# Patient Record
Sex: Male | Born: 1935 | Race: White | Hispanic: No | Marital: Married | Smoking: Never smoker
Health system: Southern US, Community
[De-identification: ages and names within clinical notes are randomized; demographics above are authoritative.]

## PROBLEM LIST (undated history)

## (undated) DIAGNOSIS — I350 Nonrheumatic aortic (valve) stenosis: Secondary | ICD-10-CM

## (undated) DIAGNOSIS — E785 Hyperlipidemia, unspecified: Secondary | ICD-10-CM

## (undated) DIAGNOSIS — M1990 Unspecified osteoarthritis, unspecified site: Secondary | ICD-10-CM

## (undated) DIAGNOSIS — J8410 Pulmonary fibrosis, unspecified: Secondary | ICD-10-CM

## (undated) DIAGNOSIS — C801 Malignant (primary) neoplasm, unspecified: Secondary | ICD-10-CM

## (undated) DIAGNOSIS — Z87442 Personal history of urinary calculi: Secondary | ICD-10-CM

## (undated) DIAGNOSIS — I10 Essential (primary) hypertension: Secondary | ICD-10-CM

## (undated) DIAGNOSIS — K219 Gastro-esophageal reflux disease without esophagitis: Secondary | ICD-10-CM

## (undated) DIAGNOSIS — T7840XA Allergy, unspecified, initial encounter: Secondary | ICD-10-CM

## (undated) HISTORY — DX: Malignant (primary) neoplasm, unspecified: C80.1

## (undated) HISTORY — DX: Nonrheumatic aortic (valve) stenosis: I35.0

## (undated) HISTORY — DX: Gastro-esophageal reflux disease without esophagitis: K21.9

## (undated) HISTORY — DX: Allergy, unspecified, initial encounter: T78.40XA

## (undated) HISTORY — DX: Unspecified osteoarthritis, unspecified site: M19.90

## (undated) HISTORY — PX: JOINT REPLACEMENT: SHX530

## (undated) HISTORY — DX: Personal history of urinary calculi: Z87.442

## (undated) HISTORY — DX: Essential (primary) hypertension: I10

## (undated) HISTORY — DX: Hyperlipidemia, unspecified: E78.5

## (undated) HISTORY — DX: Pulmonary fibrosis, unspecified: J84.10

## (undated) HISTORY — PX: APPENDECTOMY: SHX54

## (undated) SURGERY — RIGHT/LEFT HEART CATH AND CORONARY ANGIOGRAPHY
Anesthesia: Moderate Sedation

---

## 2004-08-30 ENCOUNTER — Ambulatory Visit: Payer: Self-pay | Admitting: Internal Medicine

## 2006-08-30 ENCOUNTER — Ambulatory Visit: Payer: Self-pay | Admitting: Urology

## 2006-09-17 ENCOUNTER — Ambulatory Visit: Payer: Self-pay | Admitting: Radiation Oncology

## 2006-09-21 ENCOUNTER — Ambulatory Visit: Payer: Self-pay | Admitting: Radiation Oncology

## 2006-10-02 ENCOUNTER — Ambulatory Visit: Payer: Self-pay | Admitting: Radiation Oncology

## 2006-10-22 ENCOUNTER — Ambulatory Visit: Payer: Self-pay | Admitting: Radiation Oncology

## 2006-11-21 ENCOUNTER — Ambulatory Visit: Payer: Self-pay | Admitting: Radiation Oncology

## 2006-12-22 ENCOUNTER — Ambulatory Visit: Payer: Self-pay | Admitting: Radiation Oncology

## 2007-01-21 ENCOUNTER — Ambulatory Visit: Payer: Self-pay | Admitting: Radiation Oncology

## 2007-03-24 ENCOUNTER — Ambulatory Visit: Payer: Self-pay | Admitting: Radiation Oncology

## 2007-04-17 ENCOUNTER — Ambulatory Visit: Payer: Self-pay | Admitting: Radiation Oncology

## 2007-04-23 ENCOUNTER — Ambulatory Visit: Payer: Self-pay | Admitting: Radiation Oncology

## 2007-09-21 ENCOUNTER — Ambulatory Visit: Payer: Self-pay | Admitting: Radiation Oncology

## 2007-10-16 ENCOUNTER — Ambulatory Visit: Payer: Self-pay | Admitting: Radiation Oncology

## 2007-10-22 ENCOUNTER — Ambulatory Visit: Payer: Self-pay | Admitting: Radiation Oncology

## 2008-04-22 ENCOUNTER — Ambulatory Visit: Payer: Self-pay | Admitting: Radiation Oncology

## 2008-05-23 ENCOUNTER — Ambulatory Visit: Payer: Self-pay | Admitting: Radiation Oncology

## 2008-10-21 ENCOUNTER — Ambulatory Visit: Payer: Self-pay | Admitting: Radiation Oncology

## 2008-10-26 ENCOUNTER — Ambulatory Visit: Payer: Self-pay | Admitting: Radiation Oncology

## 2008-11-20 ENCOUNTER — Ambulatory Visit: Payer: Self-pay | Admitting: Radiation Oncology

## 2009-02-07 ENCOUNTER — Ambulatory Visit: Payer: Self-pay | Admitting: Specialist

## 2009-02-24 ENCOUNTER — Inpatient Hospital Stay: Payer: Self-pay | Admitting: Specialist

## 2009-12-01 ENCOUNTER — Ambulatory Visit: Payer: Self-pay | Admitting: Urology

## 2010-05-29 ENCOUNTER — Ambulatory Visit: Payer: Self-pay | Admitting: Specialist

## 2010-06-06 ENCOUNTER — Ambulatory Visit: Payer: Self-pay | Admitting: Specialist

## 2010-06-07 ENCOUNTER — Inpatient Hospital Stay: Payer: Self-pay | Admitting: Specialist

## 2010-06-08 LAB — PATHOLOGY REPORT

## 2011-09-27 DIAGNOSIS — J3489 Other specified disorders of nose and nasal sinuses: Secondary | ICD-10-CM | POA: Diagnosis not present

## 2011-09-27 DIAGNOSIS — H6120 Impacted cerumen, unspecified ear: Secondary | ICD-10-CM | POA: Diagnosis not present

## 2011-09-27 DIAGNOSIS — H905 Unspecified sensorineural hearing loss: Secondary | ICD-10-CM | POA: Diagnosis not present

## 2011-10-10 DIAGNOSIS — H40039 Anatomical narrow angle, unspecified eye: Secondary | ICD-10-CM | POA: Diagnosis not present

## 2011-10-10 DIAGNOSIS — H35359 Cystoid macular degeneration, unspecified eye: Secondary | ICD-10-CM | POA: Diagnosis not present

## 2011-10-10 DIAGNOSIS — I10 Essential (primary) hypertension: Secondary | ICD-10-CM | POA: Diagnosis not present

## 2011-10-10 DIAGNOSIS — H2589 Other age-related cataract: Secondary | ICD-10-CM | POA: Diagnosis not present

## 2011-10-26 DIAGNOSIS — R310 Gross hematuria: Secondary | ICD-10-CM | POA: Diagnosis not present

## 2011-11-01 ENCOUNTER — Ambulatory Visit: Payer: Self-pay | Admitting: Urology

## 2011-11-01 DIAGNOSIS — Z1389 Encounter for screening for other disorder: Secondary | ICD-10-CM | POA: Diagnosis not present

## 2011-11-01 DIAGNOSIS — N200 Calculus of kidney: Secondary | ICD-10-CM | POA: Diagnosis not present

## 2011-11-01 DIAGNOSIS — K8020 Calculus of gallbladder without cholecystitis without obstruction: Secondary | ICD-10-CM | POA: Diagnosis not present

## 2011-11-01 DIAGNOSIS — K5730 Diverticulosis of large intestine without perforation or abscess without bleeding: Secondary | ICD-10-CM | POA: Diagnosis not present

## 2011-11-01 LAB — CREATININE, SERUM
Creatinine: 0.88 mg/dL (ref 0.60–1.30)
EGFR (African American): >60
EGFR (Non-African Amer.): >60

## 2011-11-05 DIAGNOSIS — N200 Calculus of kidney: Secondary | ICD-10-CM | POA: Diagnosis not present

## 2011-11-05 DIAGNOSIS — K8020 Calculus of gallbladder without cholecystitis without obstruction: Secondary | ICD-10-CM | POA: Diagnosis not present

## 2011-11-05 DIAGNOSIS — K5730 Diverticulosis of large intestine without perforation or abscess without bleeding: Secondary | ICD-10-CM | POA: Diagnosis not present

## 2011-11-05 DIAGNOSIS — Z1389 Encounter for screening for other disorder: Secondary | ICD-10-CM | POA: Diagnosis not present

## 2011-11-19 DIAGNOSIS — R310 Gross hematuria: Secondary | ICD-10-CM | POA: Diagnosis not present

## 2011-11-20 DIAGNOSIS — I10 Essential (primary) hypertension: Secondary | ICD-10-CM | POA: Diagnosis not present

## 2011-11-20 DIAGNOSIS — E785 Hyperlipidemia, unspecified: Secondary | ICD-10-CM | POA: Diagnosis not present

## 2011-12-12 DIAGNOSIS — C61 Malignant neoplasm of prostate: Secondary | ICD-10-CM | POA: Diagnosis not present

## 2011-12-14 DIAGNOSIS — M109 Gout, unspecified: Secondary | ICD-10-CM | POA: Diagnosis not present

## 2012-02-12 DIAGNOSIS — D485 Neoplasm of uncertain behavior of skin: Secondary | ICD-10-CM | POA: Diagnosis not present

## 2012-02-12 DIAGNOSIS — L821 Other seborrheic keratosis: Secondary | ICD-10-CM | POA: Diagnosis not present

## 2012-02-12 DIAGNOSIS — L820 Inflamed seborrheic keratosis: Secondary | ICD-10-CM | POA: Diagnosis not present

## 2012-02-12 DIAGNOSIS — L570 Actinic keratosis: Secondary | ICD-10-CM | POA: Diagnosis not present

## 2012-04-16 DIAGNOSIS — H40019 Open angle with borderline findings, low risk, unspecified eye: Secondary | ICD-10-CM | POA: Diagnosis not present

## 2012-04-16 DIAGNOSIS — H2589 Other age-related cataract: Secondary | ICD-10-CM | POA: Diagnosis not present

## 2012-04-16 DIAGNOSIS — H35359 Cystoid macular degeneration, unspecified eye: Secondary | ICD-10-CM | POA: Diagnosis not present

## 2012-05-07 DIAGNOSIS — J3489 Other specified disorders of nose and nasal sinuses: Secondary | ICD-10-CM | POA: Diagnosis not present

## 2012-05-07 DIAGNOSIS — H6120 Impacted cerumen, unspecified ear: Secondary | ICD-10-CM | POA: Diagnosis not present

## 2012-05-21 DIAGNOSIS — Z23 Encounter for immunization: Secondary | ICD-10-CM | POA: Diagnosis not present

## 2012-06-11 DIAGNOSIS — Z125 Encounter for screening for malignant neoplasm of prostate: Secondary | ICD-10-CM | POA: Diagnosis not present

## 2012-06-11 DIAGNOSIS — I10 Essential (primary) hypertension: Secondary | ICD-10-CM | POA: Diagnosis not present

## 2012-06-11 DIAGNOSIS — Z0000 Encounter for general adult medical examination without abnormal findings: Secondary | ICD-10-CM | POA: Diagnosis not present

## 2012-06-11 DIAGNOSIS — E785 Hyperlipidemia, unspecified: Secondary | ICD-10-CM | POA: Diagnosis not present

## 2012-06-11 DIAGNOSIS — D4959 Neoplasm of unspecified behavior of other genitourinary organ: Secondary | ICD-10-CM | POA: Diagnosis not present

## 2012-06-13 ENCOUNTER — Ambulatory Visit: Payer: Self-pay | Admitting: Family Medicine

## 2012-06-13 DIAGNOSIS — Q619 Cystic kidney disease, unspecified: Secondary | ICD-10-CM | POA: Diagnosis not present

## 2012-06-13 DIAGNOSIS — N200 Calculus of kidney: Secondary | ICD-10-CM | POA: Diagnosis not present

## 2012-06-13 DIAGNOSIS — K8020 Calculus of gallbladder without cholecystitis without obstruction: Secondary | ICD-10-CM | POA: Diagnosis not present

## 2012-06-13 DIAGNOSIS — R112 Nausea with vomiting, unspecified: Secondary | ICD-10-CM | POA: Diagnosis not present

## 2012-06-13 DIAGNOSIS — R0989 Other specified symptoms and signs involving the circulatory and respiratory systems: Secondary | ICD-10-CM | POA: Diagnosis not present

## 2012-06-13 DIAGNOSIS — R109 Unspecified abdominal pain: Secondary | ICD-10-CM | POA: Diagnosis not present

## 2012-10-28 DIAGNOSIS — H40009 Preglaucoma, unspecified, unspecified eye: Secondary | ICD-10-CM | POA: Diagnosis not present

## 2012-10-28 DIAGNOSIS — E7800 Pure hypercholesterolemia, unspecified: Secondary | ICD-10-CM | POA: Diagnosis not present

## 2012-10-28 DIAGNOSIS — M5412 Radiculopathy, cervical region: Secondary | ICD-10-CM | POA: Diagnosis not present

## 2012-10-28 DIAGNOSIS — I10 Essential (primary) hypertension: Secondary | ICD-10-CM | POA: Diagnosis not present

## 2012-10-28 DIAGNOSIS — G5600 Carpal tunnel syndrome, unspecified upper limb: Secondary | ICD-10-CM | POA: Diagnosis not present

## 2012-11-06 DIAGNOSIS — H6120 Impacted cerumen, unspecified ear: Secondary | ICD-10-CM | POA: Diagnosis not present

## 2012-11-20 DIAGNOSIS — G5600 Carpal tunnel syndrome, unspecified upper limb: Secondary | ICD-10-CM | POA: Diagnosis not present

## 2012-11-20 DIAGNOSIS — M5412 Radiculopathy, cervical region: Secondary | ICD-10-CM | POA: Diagnosis not present

## 2012-11-24 DIAGNOSIS — M19049 Primary osteoarthritis, unspecified hand: Secondary | ICD-10-CM | POA: Diagnosis not present

## 2012-11-24 DIAGNOSIS — G5600 Carpal tunnel syndrome, unspecified upper limb: Secondary | ICD-10-CM | POA: Diagnosis not present

## 2012-11-24 DIAGNOSIS — M6530 Trigger finger, unspecified finger: Secondary | ICD-10-CM | POA: Diagnosis not present

## 2012-12-02 DIAGNOSIS — M5412 Radiculopathy, cervical region: Secondary | ICD-10-CM | POA: Diagnosis not present

## 2012-12-04 DIAGNOSIS — E785 Hyperlipidemia, unspecified: Secondary | ICD-10-CM | POA: Diagnosis not present

## 2012-12-04 DIAGNOSIS — I10 Essential (primary) hypertension: Secondary | ICD-10-CM | POA: Diagnosis not present

## 2012-12-05 DIAGNOSIS — M5412 Radiculopathy, cervical region: Secondary | ICD-10-CM | POA: Diagnosis not present

## 2012-12-09 DIAGNOSIS — M5412 Radiculopathy, cervical region: Secondary | ICD-10-CM | POA: Diagnosis not present

## 2012-12-12 DIAGNOSIS — M5412 Radiculopathy, cervical region: Secondary | ICD-10-CM | POA: Diagnosis not present

## 2012-12-16 DIAGNOSIS — M5412 Radiculopathy, cervical region: Secondary | ICD-10-CM | POA: Diagnosis not present

## 2012-12-19 DIAGNOSIS — M5412 Radiculopathy, cervical region: Secondary | ICD-10-CM | POA: Diagnosis not present

## 2012-12-22 DIAGNOSIS — M5412 Radiculopathy, cervical region: Secondary | ICD-10-CM | POA: Diagnosis not present

## 2012-12-25 DIAGNOSIS — M5412 Radiculopathy, cervical region: Secondary | ICD-10-CM | POA: Diagnosis not present

## 2012-12-25 DIAGNOSIS — G5600 Carpal tunnel syndrome, unspecified upper limb: Secondary | ICD-10-CM | POA: Diagnosis not present

## 2013-01-06 DIAGNOSIS — M109 Gout, unspecified: Secondary | ICD-10-CM | POA: Diagnosis not present

## 2013-01-06 DIAGNOSIS — I10 Essential (primary) hypertension: Secondary | ICD-10-CM | POA: Diagnosis not present

## 2013-01-14 DIAGNOSIS — C61 Malignant neoplasm of prostate: Secondary | ICD-10-CM | POA: Diagnosis not present

## 2013-01-14 DIAGNOSIS — N200 Calculus of kidney: Secondary | ICD-10-CM | POA: Diagnosis not present

## 2013-01-14 DIAGNOSIS — Z8546 Personal history of malignant neoplasm of prostate: Secondary | ICD-10-CM | POA: Diagnosis not present

## 2013-01-14 DIAGNOSIS — N281 Cyst of kidney, acquired: Secondary | ICD-10-CM | POA: Diagnosis not present

## 2013-02-02 DIAGNOSIS — G5600 Carpal tunnel syndrome, unspecified upper limb: Secondary | ICD-10-CM | POA: Diagnosis not present

## 2013-02-02 DIAGNOSIS — M5412 Radiculopathy, cervical region: Secondary | ICD-10-CM | POA: Diagnosis not present

## 2013-03-02 DIAGNOSIS — L578 Other skin changes due to chronic exposure to nonionizing radiation: Secondary | ICD-10-CM | POA: Diagnosis not present

## 2013-03-02 DIAGNOSIS — D1800 Hemangioma unspecified site: Secondary | ICD-10-CM | POA: Diagnosis not present

## 2013-03-02 DIAGNOSIS — R21 Rash and other nonspecific skin eruption: Secondary | ICD-10-CM | POA: Diagnosis not present

## 2013-03-02 DIAGNOSIS — Z85828 Personal history of other malignant neoplasm of skin: Secondary | ICD-10-CM | POA: Diagnosis not present

## 2013-03-02 DIAGNOSIS — L570 Actinic keratosis: Secondary | ICD-10-CM | POA: Diagnosis not present

## 2013-03-02 DIAGNOSIS — L821 Other seborrheic keratosis: Secondary | ICD-10-CM | POA: Diagnosis not present

## 2013-03-05 DIAGNOSIS — R7989 Other specified abnormal findings of blood chemistry: Secondary | ICD-10-CM | POA: Diagnosis not present

## 2013-03-26 DIAGNOSIS — Z23 Encounter for immunization: Secondary | ICD-10-CM | POA: Diagnosis not present

## 2013-04-21 ENCOUNTER — Ambulatory Visit: Payer: Self-pay | Admitting: Specialist

## 2013-04-21 DIAGNOSIS — G5600 Carpal tunnel syndrome, unspecified upper limb: Secondary | ICD-10-CM | POA: Diagnosis not present

## 2013-04-21 DIAGNOSIS — M25559 Pain in unspecified hip: Secondary | ICD-10-CM | POA: Diagnosis not present

## 2013-04-21 DIAGNOSIS — Z96649 Presence of unspecified artificial hip joint: Secondary | ICD-10-CM | POA: Diagnosis not present

## 2013-04-29 DIAGNOSIS — H40009 Preglaucoma, unspecified, unspecified eye: Secondary | ICD-10-CM | POA: Diagnosis not present

## 2013-05-14 DIAGNOSIS — G5600 Carpal tunnel syndrome, unspecified upper limb: Secondary | ICD-10-CM | POA: Diagnosis not present

## 2013-05-14 DIAGNOSIS — Z96649 Presence of unspecified artificial hip joint: Secondary | ICD-10-CM | POA: Diagnosis not present

## 2013-05-14 DIAGNOSIS — M19049 Primary osteoarthritis, unspecified hand: Secondary | ICD-10-CM | POA: Diagnosis not present

## 2013-05-25 DIAGNOSIS — H6120 Impacted cerumen, unspecified ear: Secondary | ICD-10-CM | POA: Diagnosis not present

## 2013-06-01 ENCOUNTER — Ambulatory Visit: Payer: Self-pay | Admitting: Specialist

## 2013-06-01 DIAGNOSIS — Z01810 Encounter for preprocedural cardiovascular examination: Secondary | ICD-10-CM | POA: Diagnosis not present

## 2013-06-01 DIAGNOSIS — G5600 Carpal tunnel syndrome, unspecified upper limb: Secondary | ICD-10-CM | POA: Diagnosis not present

## 2013-06-01 DIAGNOSIS — I10 Essential (primary) hypertension: Secondary | ICD-10-CM | POA: Diagnosis not present

## 2013-06-08 DIAGNOSIS — I119 Hypertensive heart disease without heart failure: Secondary | ICD-10-CM | POA: Diagnosis not present

## 2013-06-08 DIAGNOSIS — R9431 Abnormal electrocardiogram [ECG] [EKG]: Secondary | ICD-10-CM | POA: Diagnosis not present

## 2013-06-08 DIAGNOSIS — E782 Mixed hyperlipidemia: Secondary | ICD-10-CM | POA: Diagnosis not present

## 2013-06-08 DIAGNOSIS — I359 Nonrheumatic aortic valve disorder, unspecified: Secondary | ICD-10-CM | POA: Diagnosis not present

## 2013-06-10 ENCOUNTER — Ambulatory Visit: Payer: Self-pay | Admitting: Specialist

## 2013-06-10 DIAGNOSIS — M109 Gout, unspecified: Secondary | ICD-10-CM | POA: Diagnosis not present

## 2013-06-10 DIAGNOSIS — Z7982 Long term (current) use of aspirin: Secondary | ICD-10-CM | POA: Diagnosis not present

## 2013-06-10 DIAGNOSIS — M19049 Primary osteoarthritis, unspecified hand: Secondary | ICD-10-CM | POA: Diagnosis not present

## 2013-06-10 DIAGNOSIS — I10 Essential (primary) hypertension: Secondary | ICD-10-CM | POA: Diagnosis not present

## 2013-06-10 DIAGNOSIS — M129 Arthropathy, unspecified: Secondary | ICD-10-CM | POA: Diagnosis not present

## 2013-06-10 DIAGNOSIS — Z8546 Personal history of malignant neoplasm of prostate: Secondary | ICD-10-CM | POA: Diagnosis not present

## 2013-06-10 DIAGNOSIS — R011 Cardiac murmur, unspecified: Secondary | ICD-10-CM | POA: Diagnosis not present

## 2013-06-10 DIAGNOSIS — Z96649 Presence of unspecified artificial hip joint: Secondary | ICD-10-CM | POA: Diagnosis not present

## 2013-06-10 DIAGNOSIS — M65839 Other synovitis and tenosynovitis, unspecified forearm: Secondary | ICD-10-CM | POA: Diagnosis not present

## 2013-06-10 DIAGNOSIS — M6530 Trigger finger, unspecified finger: Secondary | ICD-10-CM | POA: Diagnosis not present

## 2013-06-10 DIAGNOSIS — Z91041 Radiographic dye allergy status: Secondary | ICD-10-CM | POA: Diagnosis not present

## 2013-06-10 DIAGNOSIS — G5600 Carpal tunnel syndrome, unspecified upper limb: Secondary | ICD-10-CM | POA: Diagnosis not present

## 2013-06-10 DIAGNOSIS — Z79899 Other long term (current) drug therapy: Secondary | ICD-10-CM | POA: Diagnosis not present

## 2013-06-12 DIAGNOSIS — M19049 Primary osteoarthritis, unspecified hand: Secondary | ICD-10-CM | POA: Diagnosis not present

## 2013-06-12 DIAGNOSIS — G5600 Carpal tunnel syndrome, unspecified upper limb: Secondary | ICD-10-CM | POA: Diagnosis not present

## 2013-06-12 DIAGNOSIS — M6530 Trigger finger, unspecified finger: Secondary | ICD-10-CM | POA: Diagnosis not present

## 2013-06-17 DIAGNOSIS — J84112 Idiopathic pulmonary fibrosis: Secondary | ICD-10-CM | POA: Diagnosis not present

## 2013-06-17 DIAGNOSIS — Z0000 Encounter for general adult medical examination without abnormal findings: Secondary | ICD-10-CM | POA: Diagnosis not present

## 2013-06-17 DIAGNOSIS — I10 Essential (primary) hypertension: Secondary | ICD-10-CM | POA: Diagnosis not present

## 2013-06-17 DIAGNOSIS — E785 Hyperlipidemia, unspecified: Secondary | ICD-10-CM | POA: Diagnosis not present

## 2013-06-17 DIAGNOSIS — Z125 Encounter for screening for malignant neoplasm of prostate: Secondary | ICD-10-CM | POA: Diagnosis not present

## 2013-06-22 DIAGNOSIS — M6530 Trigger finger, unspecified finger: Secondary | ICD-10-CM | POA: Diagnosis not present

## 2013-06-22 DIAGNOSIS — G5600 Carpal tunnel syndrome, unspecified upper limb: Secondary | ICD-10-CM | POA: Diagnosis not present

## 2013-06-22 DIAGNOSIS — M19049 Primary osteoarthritis, unspecified hand: Secondary | ICD-10-CM | POA: Diagnosis not present

## 2013-07-13 DIAGNOSIS — M19049 Primary osteoarthritis, unspecified hand: Secondary | ICD-10-CM | POA: Diagnosis not present

## 2013-07-13 DIAGNOSIS — G5600 Carpal tunnel syndrome, unspecified upper limb: Secondary | ICD-10-CM | POA: Diagnosis not present

## 2013-07-24 DIAGNOSIS — M19049 Primary osteoarthritis, unspecified hand: Secondary | ICD-10-CM | POA: Diagnosis not present

## 2013-07-24 DIAGNOSIS — M6281 Muscle weakness (generalized): Secondary | ICD-10-CM | POA: Diagnosis not present

## 2013-07-24 DIAGNOSIS — G5600 Carpal tunnel syndrome, unspecified upper limb: Secondary | ICD-10-CM | POA: Diagnosis not present

## 2013-07-24 DIAGNOSIS — M25649 Stiffness of unspecified hand, not elsewhere classified: Secondary | ICD-10-CM | POA: Diagnosis not present

## 2013-07-28 DIAGNOSIS — M25649 Stiffness of unspecified hand, not elsewhere classified: Secondary | ICD-10-CM | POA: Diagnosis not present

## 2013-07-28 DIAGNOSIS — R29818 Other symptoms and signs involving the nervous system: Secondary | ICD-10-CM | POA: Diagnosis not present

## 2013-07-28 DIAGNOSIS — G5600 Carpal tunnel syndrome, unspecified upper limb: Secondary | ICD-10-CM | POA: Diagnosis not present

## 2013-07-28 DIAGNOSIS — M6281 Muscle weakness (generalized): Secondary | ICD-10-CM | POA: Diagnosis not present

## 2013-07-30 DIAGNOSIS — M6281 Muscle weakness (generalized): Secondary | ICD-10-CM | POA: Diagnosis not present

## 2013-07-30 DIAGNOSIS — G5600 Carpal tunnel syndrome, unspecified upper limb: Secondary | ICD-10-CM | POA: Diagnosis not present

## 2013-07-30 DIAGNOSIS — M25649 Stiffness of unspecified hand, not elsewhere classified: Secondary | ICD-10-CM | POA: Diagnosis not present

## 2013-07-30 DIAGNOSIS — R29818 Other symptoms and signs involving the nervous system: Secondary | ICD-10-CM | POA: Diagnosis not present

## 2013-08-04 DIAGNOSIS — G5600 Carpal tunnel syndrome, unspecified upper limb: Secondary | ICD-10-CM | POA: Diagnosis not present

## 2013-08-04 DIAGNOSIS — R29818 Other symptoms and signs involving the nervous system: Secondary | ICD-10-CM | POA: Diagnosis not present

## 2013-08-04 DIAGNOSIS — M25649 Stiffness of unspecified hand, not elsewhere classified: Secondary | ICD-10-CM | POA: Diagnosis not present

## 2013-08-04 DIAGNOSIS — M6281 Muscle weakness (generalized): Secondary | ICD-10-CM | POA: Diagnosis not present

## 2013-08-06 DIAGNOSIS — M25649 Stiffness of unspecified hand, not elsewhere classified: Secondary | ICD-10-CM | POA: Diagnosis not present

## 2013-08-06 DIAGNOSIS — M6281 Muscle weakness (generalized): Secondary | ICD-10-CM | POA: Diagnosis not present

## 2013-08-06 DIAGNOSIS — R29818 Other symptoms and signs involving the nervous system: Secondary | ICD-10-CM | POA: Diagnosis not present

## 2013-08-06 DIAGNOSIS — G5600 Carpal tunnel syndrome, unspecified upper limb: Secondary | ICD-10-CM | POA: Diagnosis not present

## 2013-08-06 DIAGNOSIS — H40039 Anatomical narrow angle, unspecified eye: Secondary | ICD-10-CM | POA: Diagnosis not present

## 2013-08-13 DIAGNOSIS — M6281 Muscle weakness (generalized): Secondary | ICD-10-CM | POA: Diagnosis not present

## 2013-08-13 DIAGNOSIS — G5600 Carpal tunnel syndrome, unspecified upper limb: Secondary | ICD-10-CM | POA: Diagnosis not present

## 2013-08-13 DIAGNOSIS — R29818 Other symptoms and signs involving the nervous system: Secondary | ICD-10-CM | POA: Diagnosis not present

## 2013-08-13 DIAGNOSIS — M25649 Stiffness of unspecified hand, not elsewhere classified: Secondary | ICD-10-CM | POA: Diagnosis not present

## 2013-08-18 DIAGNOSIS — R29818 Other symptoms and signs involving the nervous system: Secondary | ICD-10-CM | POA: Diagnosis not present

## 2013-08-18 DIAGNOSIS — M25649 Stiffness of unspecified hand, not elsewhere classified: Secondary | ICD-10-CM | POA: Diagnosis not present

## 2013-08-18 DIAGNOSIS — M6281 Muscle weakness (generalized): Secondary | ICD-10-CM | POA: Diagnosis not present

## 2013-08-18 DIAGNOSIS — G5600 Carpal tunnel syndrome, unspecified upper limb: Secondary | ICD-10-CM | POA: Diagnosis not present

## 2013-08-25 DIAGNOSIS — M25649 Stiffness of unspecified hand, not elsewhere classified: Secondary | ICD-10-CM | POA: Diagnosis not present

## 2013-08-25 DIAGNOSIS — M6281 Muscle weakness (generalized): Secondary | ICD-10-CM | POA: Diagnosis not present

## 2013-08-25 DIAGNOSIS — G5600 Carpal tunnel syndrome, unspecified upper limb: Secondary | ICD-10-CM | POA: Diagnosis not present

## 2013-08-25 DIAGNOSIS — R29818 Other symptoms and signs involving the nervous system: Secondary | ICD-10-CM | POA: Diagnosis not present

## 2013-09-02 DIAGNOSIS — G5600 Carpal tunnel syndrome, unspecified upper limb: Secondary | ICD-10-CM | POA: Diagnosis not present

## 2013-09-02 DIAGNOSIS — M6281 Muscle weakness (generalized): Secondary | ICD-10-CM | POA: Diagnosis not present

## 2013-09-02 DIAGNOSIS — R29818 Other symptoms and signs involving the nervous system: Secondary | ICD-10-CM | POA: Diagnosis not present

## 2013-09-02 DIAGNOSIS — M25649 Stiffness of unspecified hand, not elsewhere classified: Secondary | ICD-10-CM | POA: Diagnosis not present

## 2013-09-04 DIAGNOSIS — M6281 Muscle weakness (generalized): Secondary | ICD-10-CM | POA: Diagnosis not present

## 2013-09-04 DIAGNOSIS — R29818 Other symptoms and signs involving the nervous system: Secondary | ICD-10-CM | POA: Diagnosis not present

## 2013-09-04 DIAGNOSIS — M25649 Stiffness of unspecified hand, not elsewhere classified: Secondary | ICD-10-CM | POA: Diagnosis not present

## 2013-09-04 DIAGNOSIS — G5600 Carpal tunnel syndrome, unspecified upper limb: Secondary | ICD-10-CM | POA: Diagnosis not present

## 2013-09-10 DIAGNOSIS — G5600 Carpal tunnel syndrome, unspecified upper limb: Secondary | ICD-10-CM | POA: Diagnosis not present

## 2013-09-10 DIAGNOSIS — M25649 Stiffness of unspecified hand, not elsewhere classified: Secondary | ICD-10-CM | POA: Diagnosis not present

## 2013-09-10 DIAGNOSIS — M6281 Muscle weakness (generalized): Secondary | ICD-10-CM | POA: Diagnosis not present

## 2013-09-10 DIAGNOSIS — R29818 Other symptoms and signs involving the nervous system: Secondary | ICD-10-CM | POA: Diagnosis not present

## 2013-09-14 DIAGNOSIS — I10 Essential (primary) hypertension: Secondary | ICD-10-CM | POA: Diagnosis not present

## 2013-09-15 DIAGNOSIS — M6281 Muscle weakness (generalized): Secondary | ICD-10-CM | POA: Diagnosis not present

## 2013-09-15 DIAGNOSIS — M25649 Stiffness of unspecified hand, not elsewhere classified: Secondary | ICD-10-CM | POA: Diagnosis not present

## 2013-09-15 DIAGNOSIS — G5600 Carpal tunnel syndrome, unspecified upper limb: Secondary | ICD-10-CM | POA: Diagnosis not present

## 2013-09-15 DIAGNOSIS — R29818 Other symptoms and signs involving the nervous system: Secondary | ICD-10-CM | POA: Diagnosis not present

## 2013-09-29 DIAGNOSIS — M6281 Muscle weakness (generalized): Secondary | ICD-10-CM | POA: Diagnosis not present

## 2013-09-29 DIAGNOSIS — M25649 Stiffness of unspecified hand, not elsewhere classified: Secondary | ICD-10-CM | POA: Diagnosis not present

## 2013-09-29 DIAGNOSIS — G5600 Carpal tunnel syndrome, unspecified upper limb: Secondary | ICD-10-CM | POA: Diagnosis not present

## 2013-09-29 DIAGNOSIS — R29818 Other symptoms and signs involving the nervous system: Secondary | ICD-10-CM | POA: Diagnosis not present

## 2013-10-01 DIAGNOSIS — M25649 Stiffness of unspecified hand, not elsewhere classified: Secondary | ICD-10-CM | POA: Diagnosis not present

## 2013-10-01 DIAGNOSIS — R29818 Other symptoms and signs involving the nervous system: Secondary | ICD-10-CM | POA: Diagnosis not present

## 2013-10-01 DIAGNOSIS — M6281 Muscle weakness (generalized): Secondary | ICD-10-CM | POA: Diagnosis not present

## 2013-10-05 DIAGNOSIS — R945 Abnormal results of liver function studies: Secondary | ICD-10-CM | POA: Diagnosis not present

## 2013-10-05 DIAGNOSIS — I10 Essential (primary) hypertension: Secondary | ICD-10-CM | POA: Diagnosis not present

## 2013-10-06 DIAGNOSIS — I10 Essential (primary) hypertension: Secondary | ICD-10-CM | POA: Diagnosis not present

## 2013-10-06 DIAGNOSIS — M25649 Stiffness of unspecified hand, not elsewhere classified: Secondary | ICD-10-CM | POA: Diagnosis not present

## 2013-10-06 DIAGNOSIS — R945 Abnormal results of liver function studies: Secondary | ICD-10-CM | POA: Diagnosis not present

## 2013-11-16 DIAGNOSIS — H40039 Anatomical narrow angle, unspecified eye: Secondary | ICD-10-CM | POA: Diagnosis not present

## 2013-11-16 DIAGNOSIS — H40019 Open angle with borderline findings, low risk, unspecified eye: Secondary | ICD-10-CM | POA: Diagnosis not present

## 2013-11-16 DIAGNOSIS — H40059 Ocular hypertension, unspecified eye: Secondary | ICD-10-CM | POA: Diagnosis not present

## 2013-11-23 DIAGNOSIS — H6120 Impacted cerumen, unspecified ear: Secondary | ICD-10-CM | POA: Diagnosis not present

## 2013-12-17 DIAGNOSIS — E785 Hyperlipidemia, unspecified: Secondary | ICD-10-CM | POA: Diagnosis not present

## 2013-12-17 DIAGNOSIS — I10 Essential (primary) hypertension: Secondary | ICD-10-CM | POA: Diagnosis not present

## 2013-12-17 DIAGNOSIS — M109 Gout, unspecified: Secondary | ICD-10-CM | POA: Diagnosis not present

## 2014-01-06 DIAGNOSIS — R42 Dizziness and giddiness: Secondary | ICD-10-CM | POA: Diagnosis not present

## 2014-01-06 DIAGNOSIS — I359 Nonrheumatic aortic valve disorder, unspecified: Secondary | ICD-10-CM | POA: Diagnosis not present

## 2014-01-06 DIAGNOSIS — E785 Hyperlipidemia, unspecified: Secondary | ICD-10-CM | POA: Diagnosis not present

## 2014-01-06 DIAGNOSIS — I10 Essential (primary) hypertension: Secondary | ICD-10-CM | POA: Diagnosis not present

## 2014-02-01 DIAGNOSIS — N200 Calculus of kidney: Secondary | ICD-10-CM | POA: Diagnosis not present

## 2014-02-01 DIAGNOSIS — Z8042 Family history of malignant neoplasm of prostate: Secondary | ICD-10-CM | POA: Diagnosis not present

## 2014-02-01 DIAGNOSIS — Z96649 Presence of unspecified artificial hip joint: Secondary | ICD-10-CM | POA: Diagnosis not present

## 2014-02-01 DIAGNOSIS — R3911 Hesitancy of micturition: Secondary | ICD-10-CM | POA: Diagnosis not present

## 2014-02-01 DIAGNOSIS — Z8249 Family history of ischemic heart disease and other diseases of the circulatory system: Secondary | ICD-10-CM | POA: Diagnosis not present

## 2014-02-01 DIAGNOSIS — C61 Malignant neoplasm of prostate: Secondary | ICD-10-CM | POA: Diagnosis not present

## 2014-02-01 DIAGNOSIS — Z8546 Personal history of malignant neoplasm of prostate: Secondary | ICD-10-CM | POA: Diagnosis not present

## 2014-03-16 DIAGNOSIS — L719 Rosacea, unspecified: Secondary | ICD-10-CM | POA: Diagnosis not present

## 2014-03-16 DIAGNOSIS — L570 Actinic keratosis: Secondary | ICD-10-CM | POA: Diagnosis not present

## 2014-03-16 DIAGNOSIS — C44611 Basal cell carcinoma of skin of unspecified upper limb, including shoulder: Secondary | ICD-10-CM | POA: Diagnosis not present

## 2014-03-16 DIAGNOSIS — Z85828 Personal history of other malignant neoplasm of skin: Secondary | ICD-10-CM | POA: Diagnosis not present

## 2014-03-16 DIAGNOSIS — D239 Other benign neoplasm of skin, unspecified: Secondary | ICD-10-CM | POA: Diagnosis not present

## 2014-03-16 DIAGNOSIS — Z1283 Encounter for screening for malignant neoplasm of skin: Secondary | ICD-10-CM | POA: Diagnosis not present

## 2014-03-16 DIAGNOSIS — L820 Inflamed seborrheic keratosis: Secondary | ICD-10-CM | POA: Diagnosis not present

## 2014-03-16 DIAGNOSIS — D485 Neoplasm of uncertain behavior of skin: Secondary | ICD-10-CM | POA: Diagnosis not present

## 2014-04-27 DIAGNOSIS — Z23 Encounter for immunization: Secondary | ICD-10-CM | POA: Diagnosis not present

## 2014-05-03 DIAGNOSIS — H40019 Open angle with borderline findings, low risk, unspecified eye: Secondary | ICD-10-CM | POA: Diagnosis not present

## 2014-05-03 DIAGNOSIS — H40059 Ocular hypertension, unspecified eye: Secondary | ICD-10-CM | POA: Diagnosis not present

## 2014-05-03 DIAGNOSIS — H40039 Anatomical narrow angle, unspecified eye: Secondary | ICD-10-CM | POA: Diagnosis not present

## 2014-05-20 DIAGNOSIS — H00015 Hordeolum externum left lower eyelid: Secondary | ICD-10-CM | POA: Diagnosis not present

## 2014-05-31 DIAGNOSIS — H6123 Impacted cerumen, bilateral: Secondary | ICD-10-CM | POA: Diagnosis not present

## 2014-05-31 DIAGNOSIS — H905 Unspecified sensorineural hearing loss: Secondary | ICD-10-CM | POA: Diagnosis not present

## 2014-05-31 DIAGNOSIS — J342 Deviated nasal septum: Secondary | ICD-10-CM | POA: Diagnosis not present

## 2014-06-04 DIAGNOSIS — H00015 Hordeolum externum left lower eyelid: Secondary | ICD-10-CM | POA: Diagnosis not present

## 2014-06-09 DIAGNOSIS — Z85828 Personal history of other malignant neoplasm of skin: Secondary | ICD-10-CM | POA: Diagnosis not present

## 2014-06-09 DIAGNOSIS — L570 Actinic keratosis: Secondary | ICD-10-CM | POA: Diagnosis not present

## 2014-06-09 DIAGNOSIS — L718 Other rosacea: Secondary | ICD-10-CM | POA: Diagnosis not present

## 2014-06-10 DIAGNOSIS — J8410 Pulmonary fibrosis, unspecified: Secondary | ICD-10-CM | POA: Diagnosis not present

## 2014-06-10 DIAGNOSIS — I10 Essential (primary) hypertension: Secondary | ICD-10-CM | POA: Diagnosis not present

## 2014-06-10 DIAGNOSIS — J309 Allergic rhinitis, unspecified: Secondary | ICD-10-CM | POA: Diagnosis not present

## 2014-06-10 DIAGNOSIS — Z23 Encounter for immunization: Secondary | ICD-10-CM | POA: Diagnosis not present

## 2014-06-10 DIAGNOSIS — I350 Nonrheumatic aortic (valve) stenosis: Secondary | ICD-10-CM | POA: Diagnosis not present

## 2014-06-10 DIAGNOSIS — C61 Malignant neoplasm of prostate: Secondary | ICD-10-CM | POA: Diagnosis not present

## 2014-06-10 DIAGNOSIS — E785 Hyperlipidemia, unspecified: Secondary | ICD-10-CM | POA: Diagnosis not present

## 2014-06-10 DIAGNOSIS — N200 Calculus of kidney: Secondary | ICD-10-CM | POA: Diagnosis not present

## 2014-06-30 DIAGNOSIS — E782 Mixed hyperlipidemia: Secondary | ICD-10-CM | POA: Diagnosis not present

## 2014-06-30 DIAGNOSIS — I471 Supraventricular tachycardia: Secondary | ICD-10-CM | POA: Diagnosis not present

## 2014-06-30 DIAGNOSIS — I10 Essential (primary) hypertension: Secondary | ICD-10-CM | POA: Diagnosis not present

## 2014-06-30 DIAGNOSIS — I350 Nonrheumatic aortic (valve) stenosis: Secondary | ICD-10-CM | POA: Diagnosis not present

## 2014-07-08 DIAGNOSIS — I10 Essential (primary) hypertension: Secondary | ICD-10-CM | POA: Diagnosis not present

## 2014-07-12 DIAGNOSIS — I471 Supraventricular tachycardia: Secondary | ICD-10-CM | POA: Diagnosis not present

## 2014-07-12 DIAGNOSIS — I10 Essential (primary) hypertension: Secondary | ICD-10-CM | POA: Diagnosis not present

## 2014-07-12 DIAGNOSIS — I350 Nonrheumatic aortic (valve) stenosis: Secondary | ICD-10-CM | POA: Diagnosis not present

## 2014-07-12 DIAGNOSIS — E782 Mixed hyperlipidemia: Secondary | ICD-10-CM | POA: Diagnosis not present

## 2014-07-12 DIAGNOSIS — R42 Dizziness and giddiness: Secondary | ICD-10-CM | POA: Diagnosis not present

## 2014-08-26 DIAGNOSIS — Z0000 Encounter for general adult medical examination without abnormal findings: Secondary | ICD-10-CM | POA: Diagnosis not present

## 2014-08-26 DIAGNOSIS — E785 Hyperlipidemia, unspecified: Secondary | ICD-10-CM | POA: Diagnosis not present

## 2014-08-26 DIAGNOSIS — I10 Essential (primary) hypertension: Secondary | ICD-10-CM | POA: Diagnosis not present

## 2014-08-26 DIAGNOSIS — J8410 Pulmonary fibrosis, unspecified: Secondary | ICD-10-CM | POA: Diagnosis not present

## 2014-08-26 DIAGNOSIS — Z125 Encounter for screening for malignant neoplasm of prostate: Secondary | ICD-10-CM | POA: Diagnosis not present

## 2014-10-13 DIAGNOSIS — N200 Calculus of kidney: Secondary | ICD-10-CM | POA: Diagnosis not present

## 2014-10-13 DIAGNOSIS — R319 Hematuria, unspecified: Secondary | ICD-10-CM | POA: Diagnosis not present

## 2014-10-13 DIAGNOSIS — I10 Essential (primary) hypertension: Secondary | ICD-10-CM | POA: Diagnosis not present

## 2014-11-12 NOTE — Op Note (Signed)
PATIENT NAME:  Andrew Ali, Andrew Ali MR#:  [id number] DATE OF BIRTH:  [birth year]  DATE OF PROCEDURE:  06/10/2013  FINAL DIAGNOSES: 1.  Severe right thumb carpometacarpal arthritis.  2.  Severe right carpal tunnel syndrome.  3.  Right small finger stenosing tenosynovitis.   PROCEDURES: 1.  Right thumb CMC joint arthroplasty using palmaris longus tendon graft.  2.  Open release of the right carpal tunnel.  3.  Open release of the right small finger flexor tendon sheath at the A1 pulley.   SURGEON: Park Breed, M.D.   ANESTHESIA: General LMA.   COMPLICATIONS: None.   DRAINS: None.   ESTIMATED BLOOD LOSS: None.   REPLACED: None.   DESCRIPTION OF PROCEDURE: The patient was brought to the Operating Room where he underwent satisfactory general LMA anesthesia in the supine position. The right arm was prepped and draped in sterile fashion. An Esmarch was applied. The tourniquet was inflated to 250 mmHg. The transverse incision was made over the A1 pulley of the right small finger and dissection carried out bluntly through subcutaneous tissue under loupe magnification. A1 pulley was resected and the tendon sheath incised proximally and distally to free up the tendon. There was moderate synovitis, which was removed. The wound was irrigated and closed with 4-0 nylon suture. Next, the carpal tunnel incision was made just to the ulnar side of midline at the base the palm. Dissection was carried out bluntly through subcutaneous tissue. The distal aspect of the volar carpal ligament was identified. Soft tissues were elevated off the volar carpal ligament and the ligament was divided under direct vision with mini blade knife. Carpal tunnel scissors were used to complete this approximately. The nerve was pale and flattened. It was freed up from adhesions with a mosquito clamp. The motor branch was identified and was intact. The wound was irrigated and closed with running 4-0 nylon suture. Next, three small  transverse incisions were made over the course of the palmaris longus tendon and the palmaris longus tendon was harvested as a free tendon graft using tendon stripper proximally. These wounds were closed with 4-0 nylon suture. Extensile incision was then made over the base of the thumb, starting volarly along the distal radius, coming dorsally and extending distally along the dorsal side of the first metacarpal. The dissection was carried out bluntly to subcutaneous tissue. The radial artery and veins were freed up and retracted with a vessel loop. The tendon sheath over the first dorsal compartment was opened completely freeing the tendons up. The capsule from the navicular to the base of the metacarpal was opened longitudinally and the capsule dissected off the bone to preserve for future closure. The trapezium was removed piecemeal. Fluoroscopy showed good excision of bone. The tendon sheath was passed beneath the flexor carpi radialis tendon and the base of the wound and brought up through a drill hole in the base of the metacarpal. This was then tied on itself and rotated into the space between the metacarpal and navicular. The capsule was then sewn up with 3-0 Vicryl suture tightly. The wound was irrigated throughout. The skin was closed with 4-0 nylon sutures. Marcaine 0.5% was placed in all wounds and a dry sterile dressing was applied along with a thumb spica splint. The tourniquet was deflated with good return of blood flow to the hand. The tourniquet time was 101 minutes. The patient was awakened and taken to recovery in good condition.    ____________________________ Park Breed, MD hem:cc D: 06/24/2013  16:38:58 ET T: 06/24/2013 19:23:34 ET JOB#: [id number]  cc: Park Breed, MD, <Dictator> Park Breed MD ELECTRONICALLY SIGNED 06/24/2013 21:16

## 2014-11-15 DIAGNOSIS — I350 Nonrheumatic aortic (valve) stenosis: Secondary | ICD-10-CM | POA: Diagnosis not present

## 2014-11-30 DIAGNOSIS — H905 Unspecified sensorineural hearing loss: Secondary | ICD-10-CM | POA: Diagnosis not present

## 2014-11-30 DIAGNOSIS — H6123 Impacted cerumen, bilateral: Secondary | ICD-10-CM | POA: Diagnosis not present

## 2015-01-12 DIAGNOSIS — E782 Mixed hyperlipidemia: Secondary | ICD-10-CM | POA: Diagnosis not present

## 2015-01-12 DIAGNOSIS — I350 Nonrheumatic aortic (valve) stenosis: Secondary | ICD-10-CM | POA: Diagnosis not present

## 2015-01-12 DIAGNOSIS — I10 Essential (primary) hypertension: Secondary | ICD-10-CM | POA: Diagnosis not present

## 2015-02-22 DIAGNOSIS — Z923 Personal history of irradiation: Secondary | ICD-10-CM | POA: Diagnosis not present

## 2015-02-22 DIAGNOSIS — K219 Gastro-esophageal reflux disease without esophagitis: Secondary | ICD-10-CM | POA: Diagnosis not present

## 2015-02-22 DIAGNOSIS — Z96643 Presence of artificial hip joint, bilateral: Secondary | ICD-10-CM | POA: Diagnosis not present

## 2015-02-22 DIAGNOSIS — N200 Calculus of kidney: Secondary | ICD-10-CM | POA: Diagnosis not present

## 2015-02-22 DIAGNOSIS — Z79899 Other long term (current) drug therapy: Secondary | ICD-10-CM | POA: Diagnosis not present

## 2015-02-22 DIAGNOSIS — I10 Essential (primary) hypertension: Secondary | ICD-10-CM | POA: Insufficient documentation

## 2015-02-22 DIAGNOSIS — Z8546 Personal history of malignant neoplasm of prostate: Secondary | ICD-10-CM | POA: Diagnosis not present

## 2015-02-22 DIAGNOSIS — I878 Other specified disorders of veins: Secondary | ICD-10-CM | POA: Diagnosis not present

## 2015-02-22 DIAGNOSIS — E785 Hyperlipidemia, unspecified: Secondary | ICD-10-CM | POA: Insufficient documentation

## 2015-02-22 DIAGNOSIS — R3911 Hesitancy of micturition: Secondary | ICD-10-CM | POA: Diagnosis not present

## 2015-03-02 ENCOUNTER — Ambulatory Visit (INDEPENDENT_AMBULATORY_CARE_PROVIDER_SITE_OTHER): Payer: Medicare Other | Admitting: Family Medicine

## 2015-03-02 ENCOUNTER — Encounter: Payer: Self-pay | Admitting: Family Medicine

## 2015-03-02 VITALS — BP 125/74 | HR 84 | Temp 98.0°F | Ht 70.0 in | Wt 150.0 lb

## 2015-03-02 DIAGNOSIS — I10 Essential (primary) hypertension: Secondary | ICD-10-CM | POA: Diagnosis not present

## 2015-03-02 DIAGNOSIS — R319 Hematuria, unspecified: Secondary | ICD-10-CM

## 2015-03-02 DIAGNOSIS — E785 Hyperlipidemia, unspecified: Secondary | ICD-10-CM | POA: Diagnosis not present

## 2015-03-02 LAB — URINALYSIS, ROUTINE W REFLEX MICROSCOPIC
Bilirubin, UA: NEGATIVE
Glucose, UA: NEGATIVE
Ketones, UA: NEGATIVE
Leukocytes, UA: NEGATIVE
NITRITE UA: NEGATIVE
PH UA: 5.5 (ref 5.0–7.5)
SPEC GRAV UA: 1.02 (ref 1.005–1.030)
Urobilinogen, Ur: 0.2 mg/dL (ref 0.2–1.0)

## 2015-03-02 LAB — LP+ALT+AST PICCOLO, WAIVED
ALT (SGPT) PICCOLO, WAIVED: 24 U/L (ref 10–47)
AST (SGOT) Piccolo, Waived: 39 U/L — ABNORMAL HIGH (ref 11–38)
CHOL/HDL RATIO PICCOLO,WAIVE: 2.5 mg/dL
Cholesterol Piccolo, Waived: 179 mg/dL (ref <200)
HDL Chol Piccolo, Waived: 72 mg/dL (ref >59)
LDL Chol Calc Piccolo Waived: 88 mg/dL (ref <100)
Triglycerides Piccolo,Waived: 98 mg/dL (ref <150)
VLDL CHOL CALC PICCOLO,WAIVE: 20 mg/dL (ref <30)

## 2015-03-02 LAB — MICROSCOPIC EXAMINATION

## 2015-03-02 MED ORDER — HYDROCHLOROTHIAZIDE 12.5 MG PO TABS
12.5000 mg | ORAL_TABLET | Freq: Every day | ORAL | Status: DC
Start: 2015-03-02 — End: 2015-08-30

## 2015-03-02 MED ORDER — ALLOPURINOL 300 MG PO TABS: 300.0000 mg | ORAL_TABLET | Freq: Every day | ORAL | Status: DC

## 2015-03-02 MED ORDER — ATORVASTATIN CALCIUM 10 MG PO TABS: 10.0000 mg | ORAL_TABLET | Freq: Every day | ORAL | Status: DC

## 2015-03-02 MED ORDER — AMLODIPINE BESYLATE 10 MG PO TABS: 10.0000 mg | ORAL_TABLET | Freq: Every day | ORAL | Status: DC

## 2015-03-02 NOTE — Progress Notes (Signed)
BP 125/74 mmHg  Pulse 84  Temp(Src) 98 F (36.7 C)  Ht '5\' 10"'$  (1.778 m)  Wt 150 lb (68.04 kg)  BMI 21.52 kg/m2  SpO2 99%   Subjective:    Patient ID: Andrew Ali, male    DOB: [birth year], 81 y.o.   MRN: [id number]  HPI: Andrew Ali is a 81 y.o. male  Chief Complaint  Patient presents with  . Hyperlipidemia  . Hypertension  . Hematuria   patient recheck hypertension hypercholesterol has been doing well with no complaints side effects from medications has been taking faithfully. Saw urologist earlier this month with good report has retained kidney stones in both kidneys which can occasionally rattle around and calls mild hematuria. No further workup is needed patient also had PSA drawn which was normal. Patient had no gout attacks or flares taking allopurinol faithfully has not needed colchicine. Relevant past medical, surgical, family and social history reviewed and updated as indicated. Interim medical history since our last visit reviewed. Allergies and medications reviewed and updated.  Review of Systems  Constitutional: Negative.   Respiratory: Negative.   Cardiovascular: Negative.     Per HPI unless specifically indicated above     Objective:    BP 125/74 mmHg  Pulse 84  Temp(Src) 98 F (36.7 C)  Ht '5\' 10"'$  (1.778 m)  Wt 150 lb (68.04 kg)  BMI 21.52 kg/m2  SpO2 99%  Wt Readings from Last 3 Encounters:  03/02/15 150 lb (68.04 kg)  11/15/14 155 lb (70.308 kg)    Physical Exam  Constitutional: He is oriented to person, place, and time. He appears well-developed and well-nourished. No distress.  HENT:  Head: Normocephalic and atraumatic.  Right Ear: Hearing normal.  Left Ear: Hearing normal.  Nose: Nose normal.  Eyes: Conjunctivae and lids are normal. Right eye exhibits no discharge. Left eye exhibits no discharge. No scleral icterus.  Cardiovascular: Normal rate, regular rhythm and normal heart sounds.   Pulmonary/Chest: Effort normal and breath  sounds normal. No respiratory distress.  Musculoskeletal: Normal range of motion.  Neurological: He is alert and oriented to person, place, and time.  Skin: Skin is intact. No rash noted.  Psychiatric: He has a normal mood and affect. His speech is normal and behavior is normal. Judgment and thought content normal. Cognition and memory are normal.    Results for orders placed or performed in visit on 03/02/15  Microscopic Examination  Result Value Ref Range   WBC, UA 0-5 0 -  5 /hpf   RBC, UA 3-10 (A) 0 -  2 /hpf   Epithelial Cells (non renal) 0-10 0 - 10 /hpf   Bacteria, UA Few None seen/Few  LP+ALT+AST Piccolo, Waived  Result Value Ref Range   ALT (SGPT) Piccolo, Waived 24 10 - 47 U/L   AST (SGOT) Piccolo, Waived 39 (H) 11 - 38 U/L   Cholesterol Piccolo, Waived 179 <200 mg/dL   HDL Chol Piccolo, Waived 72 >59 mg/dL   Triglycerides Piccolo,Waived 98 <150 mg/dL   Chol/HDL Ratio Piccolo,Waive 2.5 mg/dL   LDL Chol Calc Piccolo Waived 88 <100 mg/dL   VLDL Chol Calc Piccolo,Waive 20 <30 mg/dL  Urinalysis, Routine w reflex microscopic (not at [facility])  Result Value Ref Range   Specific Gravity, UA 1.020 1.005 - 1.030   pH, UA 5.5 5.0 - 7.5   Color, UA Yellow Yellow   Appearance Ur Clear Clear   Leukocytes, UA Negative Negative   Protein, UA Trace Negative/Trace  Glucose, UA Negative Negative   Ketones, UA Negative Negative   RBC, UA 2+ (A) Negative   Bilirubin, UA Negative Negative   Urobilinogen, Ur 0.2 0.2 - 1.0 mg/dL   Nitrite, UA Negative Negative   Microscopic Examination See below:       Assessment & Plan:   Problem List Items Addressed This Visit    None    Visit Diagnoses    Essential hypertension, benign    -  Primary    Relevant Medications    amLODipine (NORVASC) 10 MG tablet    atorvastatin (LIPITOR) 10 MG tablet    hydrochlorothiazide (HYDRODIURIL) 12.5 MG tablet    Other Relevant Orders    Basic metabolic panel    Hyperlipemia        Relevant Medications     amLODipine (NORVASC) 10 MG tablet    atorvastatin (LIPITOR) 10 MG tablet    hydrochlorothiazide (HYDRODIURIL) 12.5 MG tablet    Other Relevant Orders    LP+ALT+AST Piccolo, Waived (Completed)    Hematuria        Relevant Orders    Urinalysis, Routine w reflex microscopic (not at [facility]) (Completed)        Follow up plan: Return in about 6 months (around 09/02/2015), or if symptoms worsen or fail to improve, for Physical Exam.

## 2015-03-03 ENCOUNTER — Encounter: Payer: Self-pay | Admitting: Family Medicine

## 2015-03-03 LAB — BASIC METABOLIC PANEL
BUN/Creatinine Ratio: 16 (ref 10–22)
BUN: 16 mg/dL (ref 8–27)
CHLORIDE: 96 mmol/L — AB (ref 97–108)
CO2: 27 mmol/L (ref 18–29)
Calcium: 11 mg/dL — ABNORMAL HIGH (ref 8.6–10.2)
Creatinine, Ser: 1.02 mg/dL (ref 0.76–1.27)
GFR calc non Af Amer: 70 mL/min/{1.73_m2} (ref >59)
GFR, EST AFRICAN AMERICAN: 80 mL/min/{1.73_m2} (ref >59)
Glucose: 94 mg/dL (ref 65–99)
Potassium: 4.5 mmol/L (ref 3.5–5.2)
SODIUM: 140 mmol/L (ref 134–144)

## 2015-03-12 DIAGNOSIS — I10 Essential (primary) hypertension: Secondary | ICD-10-CM | POA: Diagnosis not present

## 2015-03-12 DIAGNOSIS — H538 Other visual disturbances: Secondary | ICD-10-CM | POA: Diagnosis not present

## 2015-03-12 DIAGNOSIS — E785 Hyperlipidemia, unspecified: Secondary | ICD-10-CM | POA: Diagnosis not present

## 2015-03-12 DIAGNOSIS — Z7982 Long term (current) use of aspirin: Secondary | ICD-10-CM | POA: Diagnosis not present

## 2015-03-12 DIAGNOSIS — R42 Dizziness and giddiness: Secondary | ICD-10-CM | POA: Diagnosis not present

## 2015-03-12 DIAGNOSIS — E876 Hypokalemia: Secondary | ICD-10-CM | POA: Diagnosis not present

## 2015-03-12 DIAGNOSIS — Z79899 Other long term (current) drug therapy: Secondary | ICD-10-CM | POA: Diagnosis not present

## 2015-03-12 DIAGNOSIS — I350 Nonrheumatic aortic (valve) stenosis: Secondary | ICD-10-CM | POA: Diagnosis not present

## 2015-03-14 ENCOUNTER — Other Ambulatory Visit: Payer: Self-pay | Admitting: Family Medicine

## 2015-03-14 ENCOUNTER — Encounter: Payer: Self-pay | Admitting: Family Medicine

## 2015-03-14 ENCOUNTER — Ambulatory Visit (INDEPENDENT_AMBULATORY_CARE_PROVIDER_SITE_OTHER): Payer: Medicare Other | Admitting: Family Medicine

## 2015-03-14 VITALS — BP 118/78 | HR 71 | Temp 98.1°F | Ht 70.5 in | Wt 151.0 lb

## 2015-03-14 DIAGNOSIS — R29818 Other symptoms and signs involving the nervous system: Secondary | ICD-10-CM | POA: Diagnosis not present

## 2015-03-14 DIAGNOSIS — E876 Hypokalemia: Secondary | ICD-10-CM

## 2015-03-14 DIAGNOSIS — R2990 Unspecified symptoms and signs involving the nervous system: Secondary | ICD-10-CM

## 2015-03-14 DIAGNOSIS — G459 Transient cerebral ischemic attack, unspecified: Secondary | ICD-10-CM

## 2015-03-14 MED ORDER — DEXLANSOPRAZOLE 60 MG PO CPDR: 60.0000 mg | DELAYED_RELEASE_CAPSULE | Freq: Every day | ORAL | Status: DC

## 2015-03-14 MED ORDER — CLOPIDOGREL BISULFATE 75 MG PO TABS: 75.0000 mg | ORAL_TABLET | Freq: Every day | ORAL | Status: DC

## 2015-03-14 NOTE — Assessment & Plan Note (Signed)
Patient with TIA like symptoms Patient education given on calling 911 and life-threatening symptoms. We'll begin workup Transesophageal cardiac ultrasound Carotid ultrasound MRI of brain Start Plavix continue aspirin

## 2015-03-14 NOTE — Progress Notes (Signed)
BP 118/78 mmHg  Pulse 71  Temp(Src) 98.1 F (36.7 C)  Ht 5' 10.5" (1.791 m)  Wt 151 lb (68.493 kg)  BMI 21.35 kg/m2  SpO2 99%   Subjective:    Patient ID: Andrew Ali, male    DOB: 11-29-1932, 81 y.o.   MRN: [id number]  HPI: Andrew Ali is a 81 y.o. male  Chief Complaint  Patient presents with  . ER F/U   patient in the ER 3 days ago Saturday evening with episodes of loss of vision ringing in both ears disorientation lasting 3-6 seconds. Patient's vision did not go black but grayed out. These episodes were 2 on Saturday night. She also had another episode very minor today with dizziness and flushing no visual changes. Patient also had flushing with the episodes on Saturday night. Patient was treated in the emergency room and sent home about 5 hours later with no firm diagnosis other than a potassium of 3.1. Patient's been on potassium replacement and is otherwise been okay. 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 Interim medical history since our last visit reviewed. Allergies and medications reviewed and updated.  Review of Systems  Constitutional: Negative.   HENT: Negative.   Eyes: Negative.   Respiratory: Negative.   Cardiovascular: Negative.   Endocrine: Negative.   Musculoskeletal: Negative.   Skin: Negative.   Allergic/Immunologic: Negative.   Neurological: Negative.   Hematological: Negative.   Psychiatric/Behavioral: Negative.   these are all neg now   Per HPI unless specifically indicated above     Objective:    BP 118/78 mmHg  Pulse 71  Temp(Src) 98.1 F (36.7 C)  Ht 5' 10.5" (1.791 m)  Wt 151 lb (68.493 kg)  BMI 21.35 kg/m2  SpO2 99%  Wt Readings from Last 3 Encounters:  03/14/15 151 lb (68.493 kg)  03/02/15 150 lb (68.04 kg)  11/15/14 155 lb (70.308 kg)    Physical  Exam  Constitutional: He is oriented to person, place, and time. He appears well-developed and well-nourished. No distress.  HENT:  Head: Normocephalic and atraumatic.  Right Ear: Hearing normal.  Left Ear: Hearing normal.  Nose: Nose normal.  Eyes: Conjunctivae, EOM and lids are normal. Pupils are equal, round, and reactive to light. Right eye exhibits no discharge. Left eye exhibits no discharge. No scleral icterus.  Neck: Normal range of motion. Neck supple. No tracheal deviation present. No thyromegaly present.  No bruit  Cardiovascular: Normal rate and regular rhythm.   Murmur heard. 3/6SM aortic  Pulmonary/Chest: Effort normal and breath sounds normal. No respiratory distress. He has no rales.  Abdominal: Soft. Bowel sounds are normal. He exhibits no mass. There is no tenderness. There is no rebound.  Musculoskeletal: Normal range of motion. He exhibits no edema or tenderness.  Lymphadenopathy:    He has no cervical adenopathy.  Neurological: He is alert and oriented to person, place, and time. He has normal reflexes. He displays no atrophy, no tremor and normal reflexes. No sensory deficit. He displays no seizure activity. Gait normal. He displays no Babinski's sign on the right side. He displays no Babinski's sign on the left side.  Skin: Skin is intact. No rash noted.  Psychiatric: He has a normal mood and affect. His speech is normal and behavior is normal. Judgment and thought content normal.  Cognition and memory are normal.    Results for orders placed or performed in visit on 03/02/15  Microscopic Examination  Result Value Ref Range   WBC, UA 0-5 0 -  5 /hpf   RBC, UA 3-10 (A) 0 -  2 /hpf   Epithelial Cells (non renal) 0-10 0 - 10 /hpf   Bacteria, UA Few None seen/Few  LP+ALT+AST Piccolo, Waived  Result Value Ref Range   ALT (SGPT) Piccolo, Waived 24 10 - 47 U/L   AST (SGOT) Piccolo, Waived 39 (H) 11 - 38 U/L   Cholesterol Piccolo, Waived 179 <200 mg/dL   HDL Chol  Piccolo, Waived 72 >59 mg/dL   Triglycerides Piccolo,Waived 98 <150 mg/dL   Chol/HDL Ratio Piccolo,Waive 2.5 mg/dL   LDL Chol Calc Piccolo Waived 88 <100 mg/dL   VLDL Chol Calc Piccolo,Waive 20 <30 mg/dL  Urinalysis, Routine w reflex microscopic (not at [facility])  Result Value Ref Range   Specific Gravity, UA 1.020 1.005 - 1.030   pH, UA 5.5 5.0 - 7.5   Color, UA Yellow Yellow   Appearance Ur Clear Clear   Leukocytes, UA Negative Negative   Protein, UA Trace Negative/Trace   Glucose, UA Negative Negative   Ketones, UA Negative Negative   RBC, UA 2+ (A) Negative   Bilirubin, UA Negative Negative   Urobilinogen, Ur 0.2 0.2 - 1.0 mg/dL   Nitrite, UA Negative Negative   Microscopic Examination See below:   Basic metabolic panel  Result Value Ref Range   Glucose 94 65 - 99 mg/dL   BUN 16 8 - 27 mg/dL   Creatinine, Ser 1.02 0.76 - 1.27 mg/dL   GFR calc non Af Amer 70 >59 mL/min/1.73   GFR calc Af Amer 80 >59 mL/min/1.73   BUN/Creatinine Ratio 16 10 - 22   Sodium 140 134 - 144 mmol/L   Potassium 4.5 3.5 - 5.2 mmol/L   Chloride 96 (L) 97 - 108 mmol/L   CO2 27 18 - 29 mmol/L   Calcium 11.0 (H) 8.6 - 10.2 mg/dL      Assessment & Plan:   Problem List Items Addressed This Visit      Other   Spell of transient neurologic symptoms - Primary    Patient with TIA like symptoms Patient education given on calling 911 and life-threatening symptoms. We'll begin workup Transesophageal cardiac ultrasound Carotid ultrasound MRI of brain Start Plavix continue aspirin       Relevant Medications   clopidogrel (PLAVIX) 75 MG tablet   Other Relevant Orders   EKG 12-Lead (Completed)   Ambulatory referral to Cardiology   MR Brain W Wo Contrast    Other Visit Diagnoses    Hypokalemia        check BMP    Relevant Orders    Basic metabolic panel        Follow up plan: Return in about 4 weeks (around 04/11/2015), or if symptoms worsen or fail to improve.

## 2015-03-15 ENCOUNTER — Ambulatory Visit
Admission: RE | Admit: 2015-03-15 | Discharge: 2015-03-15 | Disposition: A | Discharge status: Home / Self Care | Payer: Medicare Other | Source: Ambulatory Visit | Attending: Family Medicine | Admitting: Family Medicine

## 2015-03-15 ENCOUNTER — Ambulatory Visit: Payer: Medicare Other

## 2015-03-15 ENCOUNTER — Other Ambulatory Visit: Payer: Self-pay

## 2015-03-15 ENCOUNTER — Telehealth: Payer: Self-pay | Admitting: Family Medicine

## 2015-03-15 ENCOUNTER — Other Ambulatory Visit: Payer: Self-pay | Admitting: Family Medicine

## 2015-03-15 ENCOUNTER — Encounter: Payer: Self-pay | Admitting: Family Medicine

## 2015-03-15 DIAGNOSIS — G459 Transient cerebral ischemic attack, unspecified: Secondary | ICD-10-CM

## 2015-03-15 DIAGNOSIS — I688 Other cerebrovascular disorders in diseases classified elsewhere: Secondary | ICD-10-CM | POA: Diagnosis not present

## 2015-03-15 DIAGNOSIS — R29818 Other symptoms and signs involving the nervous system: Secondary | ICD-10-CM | POA: Diagnosis not present

## 2015-03-15 DIAGNOSIS — G458 Other transient cerebral ischemic attacks and related syndromes: Secondary | ICD-10-CM

## 2015-03-15 DIAGNOSIS — R2990 Unspecified symptoms and signs involving the nervous system: Secondary | ICD-10-CM

## 2015-03-15 DIAGNOSIS — I6523 Occlusion and stenosis of bilateral carotid arteries: Secondary | ICD-10-CM | POA: Diagnosis not present

## 2015-03-15 LAB — BASIC METABOLIC PANEL
BUN / CREAT RATIO: 15 (ref 10–22)
BUN: 15 mg/dL (ref 8–27)
CO2: 26 mmol/L (ref 18–29)
CREATININE: 1.03 mg/dL (ref 0.76–1.27)
Calcium: 10.3 mg/dL — ABNORMAL HIGH (ref 8.6–10.2)
Chloride: 102 mmol/L (ref 97–108)
GFR, EST AFRICAN AMERICAN: 79 mL/min/{1.73_m2} (ref >59)
GFR, EST NON AFRICAN AMERICAN: 68 mL/min/{1.73_m2} (ref >59)
Glucose: 97 mg/dL (ref 65–99)
POTASSIUM: 4.4 mmol/L (ref 3.5–5.2)
SODIUM: 142 mmol/L (ref 134–144)

## 2015-03-15 MED ORDER — GADOBENATE DIMEGLUMINE 529 MG/ML IV SOLN
15.0000 mL | Freq: Once | INTRAVENOUS | Status: AC | PRN
  Administered 2015-03-15: 14 mL via INTRAVENOUS

## 2015-03-15 NOTE — Telephone Encounter (Signed)
Confirming that we had report

## 2015-03-15 NOTE — Telephone Encounter (Signed)
Please call Cassandra @ [facility] for MRI results. Please call ASAP.

## 2015-03-17 ENCOUNTER — Other Ambulatory Visit: Payer: Self-pay | Admitting: Family Medicine

## 2015-03-17 ENCOUNTER — Telehealth: Payer: Self-pay | Admitting: Family Medicine

## 2015-03-17 DIAGNOSIS — D485 Neoplasm of uncertain behavior of skin: Secondary | ICD-10-CM | POA: Diagnosis not present

## 2015-03-17 DIAGNOSIS — L812 Freckles: Secondary | ICD-10-CM | POA: Diagnosis not present

## 2015-03-17 DIAGNOSIS — Z1283 Encounter for screening for malignant neoplasm of skin: Secondary | ICD-10-CM | POA: Diagnosis not present

## 2015-03-17 DIAGNOSIS — L821 Other seborrheic keratosis: Secondary | ICD-10-CM | POA: Diagnosis not present

## 2015-03-17 DIAGNOSIS — Z85828 Personal history of other malignant neoplasm of skin: Secondary | ICD-10-CM | POA: Diagnosis not present

## 2015-03-17 DIAGNOSIS — C44519 Basal cell carcinoma of skin of other part of trunk: Secondary | ICD-10-CM | POA: Diagnosis not present

## 2015-03-17 DIAGNOSIS — L570 Actinic keratosis: Secondary | ICD-10-CM | POA: Diagnosis not present

## 2015-03-17 DIAGNOSIS — D229 Melanocytic nevi, unspecified: Secondary | ICD-10-CM | POA: Diagnosis not present

## 2015-03-17 DIAGNOSIS — D1800 Hemangioma unspecified site: Secondary | ICD-10-CM | POA: Diagnosis not present

## 2015-03-17 NOTE — Progress Notes (Signed)
Phone call Patient's spells are getting worse. To review these are preceded by a flushing sensationfollowed by a buzzing sensation in both eyes becoming blurry. This is followed by disorientation and the whole spell last 3-4 seconds. Patient's been having 3-4 a day this week but today there is been a crescendo was had about 6 episodes. Discussed with Dr. [facility]. He is going to see him tomorrow but if he gets worse overnight need to go to a [facility] emergency room and get a MRA of his brain. Patient also has a cardiac echo scheduled tomorrow

## 2015-03-17 NOTE — Telephone Encounter (Signed)
Pt came in and would like to speak to dr Jeananne Rama about getting a referral to see a nuerologist

## 2015-03-18 ENCOUNTER — Other Ambulatory Visit: Payer: Self-pay | Admitting: Neurology

## 2015-03-18 ENCOUNTER — Ambulatory Visit: Payer: Medicare Other

## 2015-03-18 DIAGNOSIS — R404 Transient alteration of awareness: Secondary | ICD-10-CM | POA: Diagnosis not present

## 2015-03-18 DIAGNOSIS — R2990 Unspecified symptoms and signs involving the nervous system: Secondary | ICD-10-CM | POA: Diagnosis not present

## 2015-03-18 DIAGNOSIS — G458 Other transient cerebral ischemic attacks and related syndromes: Secondary | ICD-10-CM

## 2015-03-22 ENCOUNTER — Ambulatory Visit
Admission: RE | Admit: 2015-03-22 | Discharge: 2015-03-22 | Disposition: A | Discharge status: Home / Self Care | Payer: Medicare Other | Source: Ambulatory Visit | Attending: Neurology | Admitting: Neurology

## 2015-03-22 DIAGNOSIS — G458 Other transient cerebral ischemic attacks and related syndromes: Secondary | ICD-10-CM | POA: Diagnosis not present

## 2015-03-22 DIAGNOSIS — G459 Transient cerebral ischemic attack, unspecified: Secondary | ICD-10-CM | POA: Diagnosis not present

## 2015-03-23 ENCOUNTER — Ambulatory Visit
Admission: RE | Admit: 2015-03-23 | Discharge: 2015-03-23 | Disposition: A | Discharge status: Home / Self Care | Payer: Medicare Other | Source: Ambulatory Visit | Attending: Family Medicine | Admitting: Family Medicine

## 2015-03-23 DIAGNOSIS — G459 Transient cerebral ischemic attack, unspecified: Secondary | ICD-10-CM | POA: Insufficient documentation

## 2015-03-24 DIAGNOSIS — G458 Other transient cerebral ischemic attacks and related syndromes: Secondary | ICD-10-CM | POA: Diagnosis not present

## 2015-03-25 DIAGNOSIS — R410 Disorientation, unspecified: Secondary | ICD-10-CM | POA: Diagnosis not present

## 2015-03-25 DIAGNOSIS — R404 Transient alteration of awareness: Secondary | ICD-10-CM | POA: Diagnosis not present

## 2015-03-25 DIAGNOSIS — R2990 Unspecified symptoms and signs involving the nervous system: Secondary | ICD-10-CM | POA: Diagnosis not present

## 2015-04-05 DIAGNOSIS — C44519 Basal cell carcinoma of skin of other part of trunk: Secondary | ICD-10-CM | POA: Diagnosis not present

## 2015-04-14 ENCOUNTER — Ambulatory Visit: Payer: Medicare Other | Admitting: Family Medicine

## 2015-04-14 DIAGNOSIS — R404 Transient alteration of awareness: Secondary | ICD-10-CM | POA: Diagnosis not present

## 2015-05-03 ENCOUNTER — Ambulatory Visit: Payer: Medicare Other | Admitting: Family Medicine

## 2015-05-04 ENCOUNTER — Ambulatory Visit: Payer: Medicare Other

## 2015-05-04 ENCOUNTER — Other Ambulatory Visit: Payer: Self-pay | Admitting: Family Medicine

## 2015-05-04 DIAGNOSIS — Z23 Encounter for immunization: Secondary | ICD-10-CM

## 2015-05-04 MED ORDER — AMOXICILLIN 875 MG PO TABS: 875.0000 mg | ORAL_TABLET | Freq: Two times a day (BID) | ORAL | Status: DC

## 2015-06-07 DIAGNOSIS — H6123 Impacted cerumen, bilateral: Secondary | ICD-10-CM | POA: Diagnosis not present

## 2015-06-07 DIAGNOSIS — H903 Sensorineural hearing loss, bilateral: Secondary | ICD-10-CM | POA: Diagnosis not present

## 2015-06-29 DIAGNOSIS — R0782 Intercostal pain: Secondary | ICD-10-CM | POA: Diagnosis not present

## 2015-06-29 DIAGNOSIS — I350 Nonrheumatic aortic (valve) stenosis: Secondary | ICD-10-CM | POA: Diagnosis not present

## 2015-06-29 DIAGNOSIS — I10 Essential (primary) hypertension: Secondary | ICD-10-CM | POA: Diagnosis not present

## 2015-06-29 DIAGNOSIS — E782 Mixed hyperlipidemia: Secondary | ICD-10-CM | POA: Diagnosis not present

## 2015-07-25 ENCOUNTER — Other Ambulatory Visit: Payer: Self-pay | Admitting: Family Medicine

## 2015-07-26 MED ORDER — DEXLANSOPRAZOLE 60 MG PO CPDR: 60.0000 mg | DELAYED_RELEASE_CAPSULE | Freq: Every day | ORAL | Status: DC

## 2015-07-28 DIAGNOSIS — L570 Actinic keratosis: Secondary | ICD-10-CM | POA: Diagnosis not present

## 2015-07-28 DIAGNOSIS — L578 Other skin changes due to chronic exposure to nonionizing radiation: Secondary | ICD-10-CM | POA: Diagnosis not present

## 2015-07-28 DIAGNOSIS — X58XXXA Exposure to other specified factors, initial encounter: Secondary | ICD-10-CM | POA: Diagnosis not present

## 2015-08-23 DIAGNOSIS — M545 Low back pain: Secondary | ICD-10-CM | POA: Diagnosis not present

## 2015-08-23 DIAGNOSIS — N200 Calculus of kidney: Secondary | ICD-10-CM | POA: Diagnosis not present

## 2015-08-23 DIAGNOSIS — J984 Other disorders of lung: Secondary | ICD-10-CM | POA: Diagnosis not present

## 2015-08-23 DIAGNOSIS — Z96643 Presence of artificial hip joint, bilateral: Secondary | ICD-10-CM | POA: Diagnosis not present

## 2015-08-30 ENCOUNTER — Ambulatory Visit (INDEPENDENT_AMBULATORY_CARE_PROVIDER_SITE_OTHER): Payer: Medicare Other | Admitting: Family Medicine

## 2015-08-30 ENCOUNTER — Encounter: Payer: Self-pay | Admitting: Family Medicine

## 2015-08-30 VITALS — BP 115/65 | HR 58 | Temp 97.7°F | Ht 69.2 in | Wt 149.0 lb

## 2015-08-30 DIAGNOSIS — R2990 Unspecified symptoms and signs involving the nervous system: Secondary | ICD-10-CM

## 2015-08-30 DIAGNOSIS — E785 Hyperlipidemia, unspecified: Secondary | ICD-10-CM

## 2015-08-30 DIAGNOSIS — Z87442 Personal history of urinary calculi: Secondary | ICD-10-CM | POA: Diagnosis not present

## 2015-08-30 DIAGNOSIS — M1000 Idiopathic gout, unspecified site: Secondary | ICD-10-CM | POA: Diagnosis not present

## 2015-08-30 DIAGNOSIS — Z0000 Encounter for general adult medical examination without abnormal findings: Secondary | ICD-10-CM

## 2015-08-30 DIAGNOSIS — R29818 Other symptoms and signs involving the nervous system: Secondary | ICD-10-CM

## 2015-08-30 DIAGNOSIS — J8410 Pulmonary fibrosis, unspecified: Secondary | ICD-10-CM

## 2015-08-30 DIAGNOSIS — I10 Essential (primary) hypertension: Secondary | ICD-10-CM

## 2015-08-30 DIAGNOSIS — M109 Gout, unspecified: Secondary | ICD-10-CM | POA: Insufficient documentation

## 2015-08-30 LAB — MICROSCOPIC EXAMINATION
Epithelial Cells (non renal): NONE SEEN /hpf (ref 0–10)
WBC, UA: NONE SEEN /hpf (ref 0–?)

## 2015-08-30 LAB — URINALYSIS, ROUTINE W REFLEX MICROSCOPIC
BILIRUBIN UA: NEGATIVE
GLUCOSE, UA: NEGATIVE
Ketones, UA: NEGATIVE
Leukocytes, UA: NEGATIVE
Nitrite, UA: NEGATIVE
Protein, UA: NEGATIVE
Specific Gravity, UA: 1.015 (ref 1.005–1.030)
UUROB: 0.2 mg/dL (ref 0.2–1.0)
pH, UA: 5 (ref 5.0–7.5)

## 2015-08-30 MED ORDER — OXYCODONE-ACETAMINOPHEN 5-325 MG PO TABS: 1.0000 | ORAL_TABLET | Freq: Three times a day (TID) | ORAL | Status: DC | PRN

## 2015-08-30 MED ORDER — ALLOPURINOL 300 MG PO TABS: 300.0000 mg | ORAL_TABLET | Freq: Every day | ORAL | Status: DC

## 2015-08-30 MED ORDER — LAMOTRIGINE 25 MG PO TABS: 25.0000 mg | ORAL_TABLET | Freq: Every day | ORAL | Status: DC

## 2015-08-30 MED ORDER — HYDROCHLOROTHIAZIDE 12.5 MG PO TABS: 12.5000 mg | ORAL_TABLET | Freq: Every day | ORAL | Status: DC

## 2015-08-30 MED ORDER — AMLODIPINE BESYLATE 10 MG PO TABS: 10.0000 mg | ORAL_TABLET | Freq: Every day | ORAL | Status: DC

## 2015-08-30 MED ORDER — ESOMEPRAZOLE MAGNESIUM 40 MG PO CPDR: 40.0000 mg | DELAYED_RELEASE_CAPSULE | Freq: Every day | ORAL | Status: DC

## 2015-08-30 NOTE — Assessment & Plan Note (Signed)
Patient's lungs clear but has history of pulmonary fibrosis which is been stable for years now.

## 2015-08-30 NOTE — Progress Notes (Signed)
BP 115/65 mmHg  Pulse 58  Temp(Src) 97.7 F (36.5 C)  Ht 5' 9.2" (1.758 m)  Wt 149 lb (67.586 kg)  BMI 21.87 kg/m2  SpO2 99%   Subjective:    Patient ID: Andrew Ali, male    DOB: [birth year], 82 y.o.   MRN: [id number]  HPI: Andrew Ali is a 82 y.o. male  Chief Complaint  Patient presents with  . Annual Exam   Agent doing well all in all taking allopurinol with no gout symptoms blood pressure well controlled with medications Cholesterol doing well reflux stable Transient neurological symptoms well controlled with low-dose Lamictal Had recurrence of kidney stones with negative KUB symptoms have resolved Patient has prescription for tamsulosin but is decided not to take it for now. Wants to have prescription for kidney stone pain his last prescription was 2013 hasn't used any but figures it's pretty old.  Relevant past medical, surgical, family and social history reviewed and updated as indicated. Interim medical history since our last visit reviewed. Allergies and medications reviewed and updated.  Review of Systems  Constitutional: Negative.   HENT: Negative.   Eyes: Negative.   Respiratory: Negative.   Cardiovascular: Negative.   Gastrointestinal: Negative.   Endocrine: Negative.   Genitourinary: Negative.   Musculoskeletal: Negative.   Skin: Negative.   Allergic/Immunologic: Negative.   Neurological: Negative.   Hematological: Negative.   Psychiatric/Behavioral: Negative.     Per HPI unless specifically indicated above     Objective:    BP 115/65 mmHg  Pulse 58  Temp(Src) 97.7 F (36.5 C)  Ht 5' 9.2" (1.758 m)  Wt 149 lb (67.586 kg)  BMI 21.87 kg/m2  SpO2 99%  Wt Readings from Last 3 Encounters:  08/30/15 149 lb (67.586 kg)  03/14/15 151 lb (68.493 kg)  03/02/15 150 lb (68.04 kg)    Physical Exam  Constitutional: He is oriented to person, place, and time. He appears well-developed and well-nourished.  HENT:  Head: Normocephalic and  atraumatic.  Right Ear: External ear normal.  Left Ear: External ear normal.  Eyes: Conjunctivae and EOM are normal. Pupils are equal, round, and reactive to light.  Neck: Normal range of motion. Neck supple.  Cardiovascular: Normal rate, regular rhythm, normal heart sounds and intact distal pulses.   Pulmonary/Chest: Effort normal and breath sounds normal.  Abdominal: Soft. Bowel sounds are normal. There is no splenomegaly or hepatomegaly.  Genitourinary: Rectum normal, prostate normal and penis normal.  Musculoskeletal: Normal range of motion.  Neurological: He is alert and oriented to person, place, and time. He has normal reflexes.  Skin: No rash noted. No erythema.  Psychiatric: He has a normal mood and affect. His behavior is normal. Judgment and thought content normal.    Results for orders placed or performed in visit on 31/49/70  Basic metabolic panel  Result Value Ref Range   Glucose 97 65 - 99 mg/dL   BUN 15 8 - 27 mg/dL   Creatinine, Ser 1.03 0.76 - 1.27 mg/dL   GFR calc non Af Amer 68 >59 mL/min/1.73   GFR calc Af Amer 79 >59 mL/min/1.73   BUN/Creatinine Ratio 15 10 - 22   Sodium 142 134 - 144 mmol/L   Potassium 4.4 3.5 - 5.2 mmol/L   Chloride 102 97 - 108 mmol/L   CO2 26 18 - 29 mmol/L   Calcium 10.3 (H) 8.6 - 10.2 mg/dL      Assessment & Plan:   Problem List Items Addressed This  Visit      Cardiovascular and Mediastinum   Hypertension - Primary    The current medical regimen is effective;  continue present plan and medications.       Relevant Medications   hydrochlorothiazide (HYDRODIURIL) 12.5 MG tablet   amLODipine (NORVASC) 10 MG tablet   Other Relevant Orders   Comprehensive metabolic panel   CBC with Differential/Platelet   Urinalysis, Routine w reflex microscopic (not at [facility])   TSH     Respiratory   Pulmonary fibrosis ([place])    Patient's lungs clear but has history of pulmonary fibrosis which is been stable for years now.      Relevant  Orders   Comprehensive metabolic panel   CBC with Differential/Platelet   Urinalysis, Routine w reflex microscopic (not at [facility])   TSH     Other   Spell of transient neurologic symptoms    Stable managed by neurology      Relevant Orders   Comprehensive metabolic panel   CBC with Differential/Platelet   Urinalysis, Routine w reflex microscopic (not at [facility])   TSH   Hyperlipidemia    The current medical regimen is effective;  continue present plan and medications.       Relevant Medications   hydrochlorothiazide (HYDRODIURIL) 12.5 MG tablet   amLODipine (NORVASC) 10 MG tablet   Other Relevant Orders   Comprehensive metabolic panel   Lipid panel   CBC with Differential/Platelet   Urinalysis, Routine w reflex microscopic (not at [facility])   TSH   History of kidney stones    Had a recent spell will keep tamsulosin and Percocet on standby      Gout   Relevant Orders   Uric acid    Other Visit Diagnoses    PE (physical exam), annual        Essential hypertension, benign        Relevant Medications    hydrochlorothiazide (HYDRODIURIL) 12.5 MG tablet    amLODipine (NORVASC) 10 MG tablet    Hyperlipemia        Relevant Medications    hydrochlorothiazide (HYDRODIURIL) 12.5 MG tablet    amLODipine (NORVASC) 10 MG tablet        Follow up plan: Return in about 6 months (around 02/27/2016) for Next visit, BMP, lipids, ALT, AST.

## 2015-08-30 NOTE — Assessment & Plan Note (Signed)
The current medical regimen is effective;  continue present plan and medications.  

## 2015-08-30 NOTE — Assessment & Plan Note (Signed)
Stable managed by neurology

## 2015-08-30 NOTE — Assessment & Plan Note (Signed)
Had a recent spell will keep tamsulosin and Percocet on standby

## 2015-08-31 ENCOUNTER — Encounter: Payer: Self-pay | Admitting: Family Medicine

## 2015-08-31 LAB — CBC WITH DIFFERENTIAL/PLATELET
BASOS: 1 %
Basophils Absolute: 0 10*3/uL (ref 0.0–0.2)
EOS (ABSOLUTE): 0.2 10*3/uL (ref 0.0–0.4)
EOS: 3 %
HEMATOCRIT: 42.2 % (ref 37.5–51.0)
HEMOGLOBIN: 14.2 g/dL (ref 12.6–17.7)
Immature Grans (Abs): 0 10*3/uL (ref 0.0–0.1)
Immature Granulocytes: 0 %
LYMPHS ABS: 2.7 10*3/uL (ref 0.7–3.1)
Lymphs: 49 %
MCH: 31.3 pg (ref 26.6–33.0)
MCHC: 33.6 g/dL (ref 31.5–35.7)
MCV: 93 fL (ref 79–97)
MONOCYTES: 6 %
Monocytes Absolute: 0.4 10*3/uL (ref 0.1–0.9)
NEUTROS ABS: 2.2 10*3/uL (ref 1.4–7.0)
Neutrophils: 41 %
Platelets: 157 10*3/uL (ref 150–379)
RBC: 4.54 x10E6/uL (ref 4.14–5.80)
RDW: 15.1 % (ref 12.3–15.4)
WBC: 5.5 10*3/uL (ref 3.4–10.8)

## 2015-08-31 LAB — COMPREHENSIVE METABOLIC PANEL
A/G RATIO: 2 (ref 1.1–2.5)
ALT: 22 IU/L (ref 0–44)
AST: 27 IU/L (ref 0–40)
Albumin: 4.4 g/dL (ref 3.5–4.7)
Alkaline Phosphatase: 114 IU/L (ref 39–117)
BUN/Creatinine Ratio: 19 (ref 10–22)
BUN: 16 mg/dL (ref 8–27)
Bilirubin Total: 0.5 mg/dL (ref 0.0–1.2)
CALCIUM: 9.9 mg/dL (ref 8.6–10.2)
CO2: 28 mmol/L (ref 18–29)
Chloride: 99 mmol/L (ref 96–106)
Creatinine, Ser: 0.83 mg/dL (ref 0.76–1.27)
GFR calc Af Amer: 96 mL/min/{1.73_m2} (ref >59)
GFR, EST NON AFRICAN AMERICAN: 83 mL/min/{1.73_m2} (ref >59)
Globulin, Total: 2.2 g/dL (ref 1.5–4.5)
Glucose: 98 mg/dL (ref 65–99)
POTASSIUM: 3.6 mmol/L (ref 3.5–5.2)
Sodium: 141 mmol/L (ref 134–144)
Total Protein: 6.6 g/dL (ref 6.0–8.5)

## 2015-08-31 LAB — TSH: TSH: 0.931 u[IU]/mL (ref 0.450–4.500)

## 2015-08-31 LAB — LIPID PANEL
CHOL/HDL RATIO: 2.5 ratio (ref 0.0–5.0)
Cholesterol, Total: 156 mg/dL (ref 100–199)
HDL: 63 mg/dL (ref >39)
LDL Calculated: 70 mg/dL (ref 0–99)
TRIGLYCERIDES: 115 mg/dL (ref 0–149)
VLDL Cholesterol Cal: 23 mg/dL (ref 5–40)

## 2015-08-31 LAB — URIC ACID: Uric Acid: 3.7 mg/dL (ref 3.7–8.6)

## 2015-09-08 DIAGNOSIS — L820 Inflamed seborrheic keratosis: Secondary | ICD-10-CM | POA: Diagnosis not present

## 2015-09-08 DIAGNOSIS — L570 Actinic keratosis: Secondary | ICD-10-CM | POA: Diagnosis not present

## 2015-09-08 DIAGNOSIS — L821 Other seborrheic keratosis: Secondary | ICD-10-CM | POA: Diagnosis not present

## 2015-09-08 DIAGNOSIS — L578 Other skin changes due to chronic exposure to nonionizing radiation: Secondary | ICD-10-CM | POA: Diagnosis not present

## 2015-11-16 DIAGNOSIS — R404 Transient alteration of awareness: Secondary | ICD-10-CM | POA: Diagnosis not present

## 2015-12-08 DIAGNOSIS — H903 Sensorineural hearing loss, bilateral: Secondary | ICD-10-CM | POA: Diagnosis not present

## 2015-12-08 DIAGNOSIS — H6123 Impacted cerumen, bilateral: Secondary | ICD-10-CM | POA: Diagnosis not present

## 2015-12-28 DIAGNOSIS — I350 Nonrheumatic aortic (valve) stenosis: Secondary | ICD-10-CM | POA: Diagnosis not present

## 2015-12-28 DIAGNOSIS — I10 Essential (primary) hypertension: Secondary | ICD-10-CM | POA: Diagnosis not present

## 2015-12-28 DIAGNOSIS — E782 Mixed hyperlipidemia: Secondary | ICD-10-CM | POA: Diagnosis not present

## 2015-12-28 DIAGNOSIS — I471 Supraventricular tachycardia: Secondary | ICD-10-CM | POA: Diagnosis not present

## 2015-12-28 DIAGNOSIS — R0782 Intercostal pain: Secondary | ICD-10-CM | POA: Diagnosis not present

## 2015-12-28 DIAGNOSIS — R42 Dizziness and giddiness: Secondary | ICD-10-CM | POA: Diagnosis not present

## 2015-12-28 DIAGNOSIS — I00 Rheumatic fever without heart involvement: Secondary | ICD-10-CM | POA: Diagnosis not present

## 2016-02-07 ENCOUNTER — Emergency Department: Payer: Medicare Other

## 2016-02-07 ENCOUNTER — Inpatient Hospital Stay
Admission: EM | Admit: 2016-02-07 | Discharge: 2016-02-08 | DRG: 310 | Disposition: A | Discharge status: Other Acute Inpatient Hospital | Payer: Medicare Other | Attending: Internal Medicine | Admitting: Internal Medicine

## 2016-02-07 DIAGNOSIS — Z96643 Presence of artificial hip joint, bilateral: Secondary | ICD-10-CM | POA: Diagnosis not present

## 2016-02-07 DIAGNOSIS — R0602 Shortness of breath: Secondary | ICD-10-CM | POA: Diagnosis not present

## 2016-02-07 DIAGNOSIS — R55 Syncope and collapse: Secondary | ICD-10-CM | POA: Diagnosis not present

## 2016-02-07 DIAGNOSIS — Z8673 Personal history of transient ischemic attack (TIA), and cerebral infarction without residual deficits: Secondary | ICD-10-CM | POA: Diagnosis not present

## 2016-02-07 DIAGNOSIS — Z7982 Long term (current) use of aspirin: Secondary | ICD-10-CM | POA: Diagnosis not present

## 2016-02-07 DIAGNOSIS — R42 Dizziness and giddiness: Secondary | ICD-10-CM | POA: Diagnosis not present

## 2016-02-07 DIAGNOSIS — R0782 Intercostal pain: Secondary | ICD-10-CM | POA: Diagnosis not present

## 2016-02-07 DIAGNOSIS — I10 Essential (primary) hypertension: Secondary | ICD-10-CM | POA: Diagnosis present

## 2016-02-07 DIAGNOSIS — R001 Bradycardia, unspecified: Principal | ICD-10-CM | POA: Diagnosis present

## 2016-02-07 DIAGNOSIS — Z91041 Radiographic dye allergy status: Secondary | ICD-10-CM | POA: Diagnosis not present

## 2016-02-07 DIAGNOSIS — Z8262 Family history of osteoporosis: Secondary | ICD-10-CM

## 2016-02-07 DIAGNOSIS — E782 Mixed hyperlipidemia: Secondary | ICD-10-CM | POA: Diagnosis not present

## 2016-02-07 DIAGNOSIS — I350 Nonrheumatic aortic (valve) stenosis: Secondary | ICD-10-CM | POA: Diagnosis present

## 2016-02-07 DIAGNOSIS — Z8249 Family history of ischemic heart disease and other diseases of the circulatory system: Secondary | ICD-10-CM

## 2016-02-07 DIAGNOSIS — M109 Gout, unspecified: Secondary | ICD-10-CM | POA: Diagnosis not present

## 2016-02-07 DIAGNOSIS — Z79899 Other long term (current) drug therapy: Secondary | ICD-10-CM

## 2016-02-07 DIAGNOSIS — E785 Hyperlipidemia, unspecified: Secondary | ICD-10-CM | POA: Diagnosis present

## 2016-02-07 DIAGNOSIS — I00 Rheumatic fever without heart involvement: Secondary | ICD-10-CM | POA: Diagnosis not present

## 2016-02-07 DIAGNOSIS — I471 Supraventricular tachycardia: Secondary | ICD-10-CM | POA: Diagnosis not present

## 2016-02-07 DIAGNOSIS — Z8546 Personal history of malignant neoplasm of prostate: Secondary | ICD-10-CM | POA: Diagnosis not present

## 2016-02-07 LAB — CBC
HEMATOCRIT: 44.4 % (ref 40.0–52.0)
Hemoglobin: 15.2 g/dL (ref 13.0–18.0)
MCH: 32.4 pg (ref 26.0–34.0)
MCHC: 34.3 g/dL (ref 32.0–36.0)
MCV: 94.3 fL (ref 80.0–100.0)
PLATELETS: 119 10*3/uL — AB (ref 150–440)
RBC: 4.7 MIL/uL (ref 4.40–5.90)
RDW: 14.7 % — ABNORMAL HIGH (ref 11.5–14.5)
WBC: 6.4 10*3/uL (ref 3.8–10.6)

## 2016-02-07 LAB — BASIC METABOLIC PANEL
Anion gap: 5 (ref 5–15)
BUN: 13 mg/dL (ref 6–20)
CHLORIDE: 107 mmol/L (ref 101–111)
CO2: 27 mmol/L (ref 22–32)
Calcium: 10 mg/dL (ref 8.9–10.3)
Creatinine, Ser: 0.97 mg/dL (ref 0.61–1.24)
GFR calc non Af Amer: >60 mL/min (ref >60)
Glucose, Bld: 108 mg/dL — ABNORMAL HIGH (ref 65–99)
Potassium: 3.5 mmol/L (ref 3.5–5.1)
Sodium: 139 mmol/L (ref 135–145)

## 2016-02-07 LAB — TROPONIN I: Troponin I: 0.03 ng/mL (ref <0.03)

## 2016-02-07 MED ORDER — SODIUM CHLORIDE 0.9 % IV BOLUS (SEPSIS)
500.0000 mL | Freq: Once | INTRAVENOUS | Status: AC
  Administered 2016-02-07: 500 mL via INTRAVENOUS

## 2016-02-07 NOTE — ED Notes (Signed)
Pt giving [place] sanwich tray and soda. Pts wife given a soda and warm blanket. Pt took on medication lamictal '25mg'$ . Dr Edd Fabian aware and ok with it.

## 2016-02-07 NOTE — ED Notes (Signed)
Pt states he noticed his HR has been slow since July 1st, states he called Dr. Frederick Peers this AM to make an appt, denies any syncopal epsiodes, states dizziness and occasional SOB, upon arrival pt placed on cardiac monitor, awake and alert, denies any chest pain, IV established, wife at bedside

## 2016-02-07 NOTE — ED Notes (Signed)
Pt resting in bed, wife at bedside.

## 2016-02-07 NOTE — ED Notes (Signed)
Pt reports sent here from Dr Alveria Apley office for bradycardia (30-40). Pt reports dizziness and shortness of breath, denies CP.

## 2016-02-07 NOTE — ED Notes (Signed)
Called cath lab to discuss plan of care, Dr. Clayborn Bigness spoke to Dr. Mariea Clonts about possible transfer, awaiting confirmation of plans

## 2016-02-07 NOTE — ED Notes (Signed)
Pt is sitting up on the side of the bed at this time. No distress noted.

## 2016-02-07 NOTE — ED Provider Notes (Signed)
[facility] Emergency Department Provider Note  ____________________________________________  Time seen: Approximately 2:22 PM  I have reviewed the triage vital signs and the nursing notes.   HISTORY  Chief Complaint Bradycardia    HPI Andrew Ali is a 82 y.o. male with a history of aortic stenosis, HTN, HL, and pulmonary fibrosis sent from his cardiologist's office for bradycardia. The patient reports that he has been monitored for aortic stenosis for several years. On July 1, he began to have palpitations with a heart rate in the 40s and associated diastolic blood pressures as low as the 50s. He self discontinued his HCTZ and remained only on Norvasc as his only antihypertensive. This morning he was seen by his cardiologist who noted that his aortic stenosis had worsened and that he had bradycardia as low as the mid 30s. The patient does report associated exertional shortness of breath with decreased exercise tolerance. He occasionally has lightheadedness related to exertion but has not had any syncopal episodes. He has not been having any chest pain pressure or tightness.No fever, chills.   Past Medical History  Diagnosis Date  . Aortic stenosis   . Pulmonary fibrosis ([place])   . Allergy   . Hypertension   . Cancer [facility])     prostate  . Hyperlipidemia   . Arthritis   . History of kidney stones   . GERD (gastroesophageal reflux disease)     Patient Active Problem List   Diagnosis Date Noted  . Pulmonary fibrosis ([place]) 08/30/2015  . History of kidney stones 08/30/2015  . Gout 08/30/2015  . Spell of transient neurologic symptoms 03/14/2015  . Hypertension   . Hyperlipidemia     Past Surgical History  Procedure Laterality Date  . Appendectomy    . Joint replacement Bilateral     hips    Current Outpatient Rx  Name  Route  Sig  Dispense  Refill  . allopurinol (ZYLOPRIM) 300 MG tablet   Oral   Take 1 tablet (300 mg total) by mouth daily.    90 tablet   4   . amLODipine (NORVASC) 10 MG tablet   Oral   Take 1 tablet (10 mg total) by mouth daily.   90 tablet   4   . aspirin EC 81 MG tablet   Oral   Take 81 mg by mouth daily.         Marland Kitchen atorvastatin (LIPITOR) 10 MG tablet   Oral   Take 1 tablet (10 mg total) by mouth daily.   90 tablet   2   . colchicine 0.6 MG tablet   Oral   Take 0.6 mg by mouth daily.         Marland Kitchen esomeprazole (NEXIUM) 40 MG capsule   Oral   Take 1 capsule (40 mg total) by mouth daily.   90 capsule   4   . hydrochlorothiazide (HYDRODIURIL) 12.5 MG tablet   Oral   Take 1 tablet (12.5 mg total) by mouth daily.   90 tablet   4   . lamoTRIgine (LAMICTAL) 25 MG tablet   Oral   Take 1 tablet (25 mg total) by mouth daily.   90 tablet   0   . Multiple Vitamin (MULTIVITAMIN) tablet   Oral   Take 1 tablet by mouth daily.         . Omega-3 Fatty Acids (FISH OIL PO)   Oral   Take by mouth.         Marland Kitchen  oxyCODONE-acetaminophen (ROXICET) 5-325 MG tablet   Oral   Take 1 tablet by mouth every 8 (eight) hours as needed for severe pain.   20 tablet   0     Allergies Ivp dye  Family History  Problem Relation Age of Onset  . Osteoporosis Mother   . CAD Father   . Hypertension Father   . Osteoporosis Father   . CAD Brother   . Hypertension Brother     Social History Social History  Substance Use Topics  . Smoking status: Never Smoker   . Smokeless tobacco: Never Used  . Alcohol Use: Yes    Review of Systems Constitutional: No fever/chills.Positive exertional lightheadedness. Negative syncope. Eyes: No visual changes. ENT: No sore throat. No congestion or rhinorrhea. Cardiovascular: Denies chest pain. Positive palpitations. Respiratory: Positive exertional shortness of breath.  No cough. Gastrointestinal: No abdominal pain.  No nausea, no vomiting.  No diarrhea.  No constipation. Genitourinary: Negative for dysuria. Musculoskeletal: Negative for back pain. No lower  extremity swelling. No calf pain. Skin: Negative for rash. Neurological: Negative for headaches. No focal numbness, tingling or weakness.   10-point ROS otherwise negative.  ____________________________________________   PHYSICAL EXAM:  VITAL SIGNS: ED Triage Vitals  Enc Vitals Group     BP 02/07/16 1318 172/48 mmHg     Pulse Rate 02/07/16 1318 39     Resp 02/07/16 1318 16     Temp 02/07/16 1318 97.7 F (36.5 C)     Temp Source 02/07/16 1318 Oral     SpO2 02/07/16 1318 100 %     Weight 02/07/16 1318 152 lb (68.947 kg)     Height 02/07/16 1318 6' (1.829 m)     Head Cir --      Peak Flow --      Pain Score 02/07/16 1316 1     Pain Loc --      Pain Edu? --      Excl. in [place]? --     Constitutional: Alert and oriented. Well appearing and in no acute distress. Answers questions appropriately. Eyes: Conjunctivae are normal.  EOMI. No scleral icterus. Head: Atraumatic. Nose: No congestion/rhinnorhea. Mouth/Throat: Mucous membranes are moist.  Neck: No stridor.  Supple.   Cardiovascular: Slow rate with holosystolic murmur. No rubs or gallops.  Respiratory: Increased expiratory phase compared to inspiratory phase.  No accessory muscle use or retractions. Lungs CTAB.  No wheezes, rales or ronchi. Gastrointestinal: Soft, nontender and nondistended.  No guarding or rebound.  No peritoneal signs. Musculoskeletal: No LE edema. No ttp in the calves or palpable cords.  Negative Homan's sign. Neurologic:  A&Ox3.  Speech is clear.  Face and smile are symmetric.  EOMI.  Moves all extremities well. Skin:  Skin is warm, dry and intact. No rash noted. Psychiatric: Mood and affect are normal. Speech and behavior are normal.  Normal judgement.  ____________________________________________   LABS (all labs ordered are listed, but only abnormal results are displayed)  Labs Reviewed  BASIC METABOLIC PANEL - Abnormal; Notable for the following:    Glucose, Bld 108 (*)    All other components  within normal limits  CBC - Abnormal; Notable for the following:    RDW 14.7 (*)    Platelets 119 (*)    All other components within normal limits  TROPONIN I - Abnormal; Notable for the following:    Troponin I 0.03 (*)    All other components within normal limits   ____________________________________________  EKG  ED  ECG REPORT I, Eula Listen, the attending physician, personally viewed and interpreted this ECG.   Date: 02/07/2016  EKG Time: 1216  Rate: 38  Rhythm: sinus bradycardia  Axis: leftward  Intervals: Widened QRS with nonspecific interventricular block  ST&T Change: 1 mm ST elevation in V3. No, 10 ST depression.  ED ECG REPORT I, Eula Listen, the attending physician, personally viewed and interpreted this ECG.   Date: 02/07/2016  EKG Time: 1318  Rate: 43  Rhythm: sinus bradycardia  Axis: Leftward  Intervals:nonspecific intraventricular conduction delay  ST&T Change: 1 mm of ST elevation in V3 and V4, similar to previous EKG.  The morphology of both of these EKGs are similar to the patient's previous EKGs.  ____________________________________________  RADIOLOGY  Dg Chest Portable 1 View  02/07/2016  CLINICAL DATA:  Shortness of breath and dizziness with slow heart rate. EXAM: PORTABLE CHEST 1 VIEW COMPARISON:  CT 11/05/2011 FINDINGS: Lungs are adequately inflated without focal consolidation or effusion. There is a calcified granuloma over the left base. Mild cardiomegaly. Calcified plaque over the aortic arch. Minimal degenerative change of the spine. IMPRESSION: No acute cardiopulmonary disease. Mild cardiomegaly. Aortic atherosclerosis. Electronically Signed   By: Marin Olp M.D.   On: 02/07/2016 15:19    ____________________________________________   PROCEDURES  Procedure(s) performed: None  Critical Care performed: Yes, see critical care note(s) ____________________________________________   INITIAL IMPRESSION / ASSESSMENT  AND PLAN / ED COURSE  Pertinent labs & imaging results that were available during my care of the patient were reviewed by me and considered in my medical decision making (see chart for details).  82 y.o. male with progressively worsening aortic stenosis, now with new symptomatic bradycardia. I've spoken with [facility] of the local cardiologist who states that the patient will need to be transferred to [facility] for his aortic stenosis evaluation. He will likely also require cardiac catheterization, as well as possible pacemaker implantation given his new bradycardia. At this time, the patient is mentating well, and shows no signs of discomfort. I have contacted the [facility] transfer center and I'm awaiting their response.  CRITICAL CARE Performed by: Eula Listen   Total critical care time: 40 minutes  Critical care time was exclusive of separately billable procedures and treating other patients.  Critical care was necessary to treat or prevent imminent or life-threatening deterioration.  Critical care was time spent personally by me on the following activities: development of treatment plan with patient and/or surrogate as well as nursing, discussions with consultants, evaluation of patient's response to treatment, examination of patient, obtaining history from patient or surrogate, ordering and performing treatments and interventions, ordering and review of laboratory studies, ordering and review of radiographic studies, pulse oximetry and re-evaluation of patient's condition.  ____________________________________________  FINAL CLINICAL IMPRESSION(S) / ED DIAGNOSES  Final diagnoses:  Sinus bradycardia  Exertional shortness of breath  Aortic stenosis      NEW MEDICATIONS STARTED DURING THIS VISIT:  New Prescriptions   No medications on file     Eula Listen, MD 02/07/16 1525

## 2016-02-07 NOTE — ED Notes (Signed)
Pt placed on AED pads, code cart at bedside

## 2016-02-07 NOTE — ED Notes (Signed)
Dr. Mariea Clonts notified of critical trop of 0.03

## 2016-02-08 ENCOUNTER — Ambulatory Visit (HOSPITAL_COMMUNITY)
Admission: AD | Admit: 2016-02-08 | Discharge: 2016-02-08 | Disposition: A | Discharge status: Home / Self Care | Payer: Medicare Other | Source: Other Acute Inpatient Hospital | Attending: Internal Medicine | Admitting: Internal Medicine

## 2016-02-08 DIAGNOSIS — I442 Atrioventricular block, complete: Secondary | ICD-10-CM | POA: Diagnosis present

## 2016-02-08 DIAGNOSIS — Z8546 Personal history of malignant neoplasm of prostate: Secondary | ICD-10-CM | POA: Diagnosis not present

## 2016-02-08 DIAGNOSIS — R918 Other nonspecific abnormal finding of lung field: Secondary | ICD-10-CM | POA: Diagnosis not present

## 2016-02-08 DIAGNOSIS — R001 Bradycardia, unspecified: Secondary | ICD-10-CM | POA: Diagnosis present

## 2016-02-08 DIAGNOSIS — R911 Solitary pulmonary nodule: Secondary | ICD-10-CM | POA: Diagnosis not present

## 2016-02-08 DIAGNOSIS — I447 Left bundle-branch block, unspecified: Secondary | ICD-10-CM | POA: Diagnosis present

## 2016-02-08 DIAGNOSIS — Z91041 Radiographic dye allergy status: Secondary | ICD-10-CM | POA: Diagnosis not present

## 2016-02-08 DIAGNOSIS — E782 Mixed hyperlipidemia: Secondary | ICD-10-CM | POA: Diagnosis present

## 2016-02-08 DIAGNOSIS — C61 Malignant neoplasm of prostate: Secondary | ICD-10-CM | POA: Diagnosis present

## 2016-02-08 DIAGNOSIS — I441 Atrioventricular block, second degree: Secondary | ICD-10-CM | POA: Diagnosis not present

## 2016-02-08 DIAGNOSIS — E785 Hyperlipidemia, unspecified: Secondary | ICD-10-CM | POA: Diagnosis not present

## 2016-02-08 DIAGNOSIS — I10 Essential (primary) hypertension: Secondary | ICD-10-CM | POA: Diagnosis present

## 2016-02-08 DIAGNOSIS — Z950 Presence of cardiac pacemaker: Secondary | ICD-10-CM | POA: Diagnosis not present

## 2016-02-08 DIAGNOSIS — R55 Syncope and collapse: Secondary | ICD-10-CM | POA: Diagnosis not present

## 2016-02-08 DIAGNOSIS — I00 Rheumatic fever without heart involvement: Secondary | ICD-10-CM | POA: Diagnosis present

## 2016-02-08 DIAGNOSIS — K219 Gastro-esophageal reflux disease without esophagitis: Secondary | ICD-10-CM | POA: Diagnosis present

## 2016-02-08 DIAGNOSIS — J8410 Pulmonary fibrosis, unspecified: Secondary | ICD-10-CM | POA: Diagnosis present

## 2016-02-08 DIAGNOSIS — J449 Chronic obstructive pulmonary disease, unspecified: Secondary | ICD-10-CM | POA: Diagnosis present

## 2016-02-08 DIAGNOSIS — Z8262 Family history of osteoporosis: Secondary | ICD-10-CM | POA: Diagnosis not present

## 2016-02-08 DIAGNOSIS — Z96641 Presence of right artificial hip joint: Secondary | ICD-10-CM | POA: Diagnosis present

## 2016-02-08 DIAGNOSIS — Z79899 Other long term (current) drug therapy: Secondary | ICD-10-CM | POA: Diagnosis not present

## 2016-02-08 DIAGNOSIS — Z8673 Personal history of transient ischemic attack (TIA), and cerebral infarction without residual deficits: Secondary | ICD-10-CM | POA: Diagnosis not present

## 2016-02-08 DIAGNOSIS — Z923 Personal history of irradiation: Secondary | ICD-10-CM | POA: Diagnosis not present

## 2016-02-08 DIAGNOSIS — Z7982 Long term (current) use of aspirin: Secondary | ICD-10-CM | POA: Diagnosis not present

## 2016-02-08 DIAGNOSIS — M109 Gout, unspecified: Secondary | ICD-10-CM | POA: Diagnosis not present

## 2016-02-08 DIAGNOSIS — I350 Nonrheumatic aortic (valve) stenosis: Secondary | ICD-10-CM | POA: Diagnosis present

## 2016-02-08 DIAGNOSIS — Z96643 Presence of artificial hip joint, bilateral: Secondary | ICD-10-CM | POA: Diagnosis not present

## 2016-02-08 DIAGNOSIS — I453 Trifascicular block: Secondary | ICD-10-CM | POA: Diagnosis present

## 2016-02-08 DIAGNOSIS — Z8249 Family history of ischemic heart disease and other diseases of the circulatory system: Secondary | ICD-10-CM | POA: Diagnosis not present

## 2016-02-08 DIAGNOSIS — N200 Calculus of kidney: Secondary | ICD-10-CM | POA: Diagnosis present

## 2016-02-08 DIAGNOSIS — M1A00X0 Idiopathic chronic gout, unspecified site, without tophus (tophi): Secondary | ICD-10-CM | POA: Diagnosis not present

## 2016-02-08 DIAGNOSIS — M1A9XX0 Chronic gout, unspecified, without tophus (tophi): Secondary | ICD-10-CM | POA: Diagnosis present

## 2016-02-08 LAB — APTT: APTT: 30 s (ref 24–36)

## 2016-02-08 LAB — PROTIME-INR
INR: 1.08
PROTHROMBIN TIME: 14.2 s (ref 11.4–15.0)

## 2016-02-08 LAB — GLUCOSE, CAPILLARY: GLUCOSE-CAPILLARY: 91 mg/dL (ref 65–99)

## 2016-02-08 MED ORDER — HEPARIN SODIUM (PORCINE) 5000 UNIT/ML IJ SOLN: 4000.0000 [IU] | Freq: Once | INTRAMUSCULAR | Status: DC

## 2016-02-08 MED ORDER — HEPARIN BOLUS VIA INFUSION
4000.0000 [IU] | Freq: Once | INTRAVENOUS | Status: AC
  Administered 2016-02-08: 4000 [IU] via INTRAVENOUS
  Filled 2016-02-08: qty 4000

## 2016-02-08 MED ORDER — HEPARIN (PORCINE) IN NACL 100-0.45 UNIT/ML-% IJ SOLN
800.0000 [IU]/h | Freq: Once | INTRAMUSCULAR | Status: AC
  Administered 2016-02-08: 800 [IU]/h via INTRAVENOUS
  Filled 2016-02-08: qty 250

## 2016-02-08 MED ORDER — LAMOTRIGINE 25 MG PO TABS
25.0000 mg | ORAL_TABLET | Freq: Once | ORAL | Status: AC
Start: 2016-02-08 — End: 2016-02-08
  Administered 2016-02-08: 25 mg via ORAL
  Filled 2016-02-08: qty 1

## 2016-02-08 NOTE — ED Notes (Addendum)
Dr. Dahlia Client made aware pt needs a diet order, lab orders, and home meds restarted due to not knowing when pt is going to transfer to [facility]. Webster MD tried to get pt directly to [facility] ER but they were divertingpts .  Earl Gala MD aware of these issues. Both MD aware of pts HR dropping down to 27 at times every 1.5hours. Pt's HR ranges from 30-60.

## 2016-02-08 NOTE — H&P (Signed)
[place] [facility] [place] NAME: Andrew Ali    MR#:  [id number]  DATE OF BIRTH:  March 25, 1933  DATE OF ADMISSION:  02/07/2016  PRIMARY CARE PHYSICIAN: Golden Pop, MD   REQUESTING/REFERRING PHYSICIAN: Dr. Lenise Arena  CHIEF COMPLAINT:   Chief Complaint  Patient presents with  . Bradycardia    HISTORY OF PRESENT ILLNESS:  Andrew Ali  is a 82 y.o. male with a known history of Moderate aortic stenosis, symptomatic, history of pulmonary fibrosis from chronic inflammation of unknown origin not on home oxygen, hypertension, arthritis and GERD presents from cardiology office secondary to symptomatic bradycardia. Other than dizziness and weakness specially with increased physical activity, patient denies any other symptoms. No chest pain. No nausea, vomiting or fevers or chills. -Remote history of paroxysmal SVT in the past. No recent palpitations. -Seen by cardiology in the office yesterday for symptomatic bradycardia and was advised to be transferred to [facility] for further intervention due to also history of his worsening aortic stenosis.  PAST MEDICAL HISTORY:   Past Medical History  Diagnosis Date  . Aortic stenosis   . Pulmonary fibrosis ([place])   . Allergy   . Hypertension   . Cancer [facility])     prostate  . Hyperlipidemia   . Arthritis   . History of kidney stones   . GERD (gastroesophageal reflux disease)     PAST SURGICAL HISTORY:   Past Surgical History  Procedure Laterality Date  . Appendectomy    . Joint replacement Bilateral     hips    SOCIAL HISTORY:   Social History  Substance Use Topics  . Smoking status: Never Smoker   . Smokeless tobacco: Never Used  . Alcohol Use: Yes    FAMILY HISTORY:   Family History  Problem Relation Age of Onset  . Osteoporosis Mother   . CAD Father   . Hypertension Father   . Osteoporosis Father   . CAD Brother   . Hypertension Brother     DRUG ALLERGIES:    Allergies  Allergen Reactions  . Ivp Dye [Iodinated Diagnostic Agents]     REVIEW OF SYSTEMS:   Review of Systems  Constitutional: Positive for malaise/fatigue. Negative for fever, chills and weight loss.  HENT: Negative for ear discharge, ear pain, hearing loss, nosebleeds and tinnitus.   Eyes: Negative for blurred vision, double vision and photophobia.  Respiratory: Negative for cough, hemoptysis, shortness of breath and wheezing.   Cardiovascular: Negative for chest pain, palpitations, orthopnea and leg swelling.  Gastrointestinal: Negative for heartburn, nausea, vomiting, abdominal pain, diarrhea, constipation and melena.  Genitourinary: Negative for dysuria, urgency, frequency and hematuria.  Musculoskeletal: Negative for myalgias, back pain and neck pain.  Skin: Negative for rash.  Neurological: Positive for dizziness and weakness. Negative for tingling, tremors, sensory change, speech change, focal weakness and headaches.  Endo/Heme/Allergies: Does not bruise/bleed easily.  Psychiatric/Behavioral: Negative for depression.    MEDICATIONS AT HOME:   Prior to Admission medications   Medication Sig Start Date End Date Taking? Authorizing Provider  allopurinol (ZYLOPRIM) 300 MG tablet Take 1 tablet (300 mg total) by mouth daily. 08/30/15  Yes Guadalupe Maple, MD  amLODipine (NORVASC) 10 MG tablet Take 1 tablet (10 mg total) by mouth daily. 08/30/15  Yes Guadalupe Maple, MD  aspirin EC 81 MG tablet Take 81 mg by mouth daily.   Yes Historical Provider, MD  atorvastatin (LIPITOR) 10 MG tablet Take 1 tablet (10 mg total)  by mouth daily. 03/02/15  Yes Guadalupe Maple, MD  colchicine 0.6 MG tablet Take 0.6 mg by mouth daily.   Yes Historical Provider, MD  esomeprazole (NEXIUM) 40 MG capsule Take 1 capsule (40 mg total) by mouth daily. 08/30/15  Yes Guadalupe Maple, MD  lamoTRIgine (LAMICTAL) 25 MG tablet Take 1 tablet (25 mg total) by mouth daily. 08/30/15  Yes Guadalupe Maple, MD  Multiple  Vitamin (MULTIVITAMIN) tablet Take 1 tablet by mouth daily.   Yes Historical Provider, MD  Omega-3 Fatty Acids (FISH OIL PO) Take by mouth.   Yes Historical Provider, MD      VITAL SIGNS:  Blood pressure 152/61, pulse 69, temperature 97.8 F (36.6 C), temperature source Oral, resp. rate 14, height '5\' 9"'$  (1.753 m), weight 68.493 kg (151 lb), SpO2 100 %.  PHYSICAL EXAMINATION:   Physical Exam  GENERAL:  82 y.o.-year-old Elderly patient lying in the bed with no acute distress.  EYES: Pupils equal, round, reactive to light and accommodation. No scleral icterus. Extraocular muscles intact.  HEENT: Head atraumatic, normocephalic. Oropharynx and nasopharynx clear.  NECK:  Supple, no jugular venous distention. No thyroid enlargement, no tenderness.  LUNGS: Normal breath sounds bilaterally, no wheezing, rales,rhonchi or crepitation. No use of accessory muscles of respiration.  CARDIOVASCULAR: S1, S2 normal. No rubs, or gallops. Soft 3/6 systolic murmur is heard both in the mitral and also aortic areas ABDOMEN: Soft, nontender, nondistended. Bowel sounds present. No organomegaly or mass.  EXTREMITIES: No pedal edema, cyanosis, or clubbing.  NEUROLOGIC: Cranial nerves II through XII are intact. Muscle strength 5/5 in all extremities. Sensation intact. Gait not checked.  PSYCHIATRIC: The patient is alert and oriented x 3.  SKIN: No obvious rash, lesion, or ulcer.   LABORATORY PANEL:   CBC  Recent Labs Lab 02/07/16 1329  WBC 6.4  HGB 15.2  HCT 44.4  PLT 119*   ------------------------------------------------------------------------------------------------------------------  Chemistries   Recent Labs Lab 02/07/16 1329  NA 139  K 3.5  CL 107  CO2 27  GLUCOSE 108*  BUN 13  CREATININE 0.97  CALCIUM 10.0   ------------------------------------------------------------------------------------------------------------------  Cardiac Enzymes  Recent Labs Lab 02/07/16 1329   TROPONINI 0.03*   ------------------------------------------------------------------------------------------------------------------  RADIOLOGY:  Dg Chest Portable 1 View  02/07/2016  CLINICAL DATA:  Shortness of breath and dizziness with slow heart rate. EXAM: PORTABLE CHEST 1 VIEW COMPARISON:  CT 11/05/2011 FINDINGS: Lungs are adequately inflated without focal consolidation or effusion. There is a calcified granuloma over the left base. Mild cardiomegaly. Calcified plaque over the aortic arch. Minimal degenerative change of the spine. IMPRESSION: No acute cardiopulmonary disease. Mild cardiomegaly. Aortic atherosclerosis. Electronically Signed   By: Marin Olp M.D.   On: 02/07/2016 15:19    EKG:   Orders placed or performed during the hospital encounter of 02/07/16  . ED EKG within 10 minutes  . ED EKG within 10 minutes  . EKG 12-Lead  . EKG 12-Lead    IMPRESSION AND PLAN:   Siddarth Hsiung  is a 82 y.o. male with a known history of Moderate aortic stenosis, symptomatic, history of pulmonary fibrosis from chronic inflammation of unknown origin not on home oxygen, hypertension, arthritis and GERD presents from cardiology office secondary to symptomatic bradycardia.  #1 Symptomatic bradycardia- BP is stable. -Transcutaneous pacer pads on -Monitor in stepdown unit. -IV heparin drip as per cardiologist recommendations. -For cardiac catheterization today. Awaiting bed to go to [facility] for further intervention.  #2 [place] aortic stenosis- will  need cath- and then likely to proceed with surgery - per cardiology eval  #3 HTN- on norvasc  #4 Gout- stable. On allopurinol and colchicine  #5 hyperlipidemia-continue statin  #6 history of TIAs with syncopal episodes-has been following up with neurology as outpatient. Continue low-dose Lamictal as it has been working well for him.  #7 DVT prophylaxis-continue IV heparin drip for now    All the records are reviewed  and case discussed with ED provider. Management plans discussed with the patient, family and they are in agreement.  CODE STATUS: Full code  TOTAL TIME TAKING CARE OF THIS PATIENT: 50 minutes.    Gladstone Lighter M.D on 02/08/2016 at 12:13 PM  Between 7am to 6pm - Pager - [phone]  After 6pm go to [email or web address] - password [email or web address] [place] [facility]  Office  [phone]  CC: Primary care physician; Golden Pop, MD

## 2016-02-08 NOTE — ED Notes (Signed)
Report given to [facility]

## 2016-02-08 NOTE — Progress Notes (Addendum)
Patient assessed, not admitted, [facility] called with a bed as soon as patient arrived to ICU 19.  Report called to [place] at [facility], patient will transport via [facility]  VS stable  HR 36-50

## 2016-02-08 NOTE — Progress Notes (Signed)
ANTICOAGULATION CONSULT NOTE - Initial Consult  Pharmacy Consult for heparin drip Indication: chest pain/ACS  Allergies  Allergen Reactions  . Ivp Dye [Iodinated Diagnostic Agents]    Patient Measurements: Height: 6' (182.9 cm) Weight: 152 lb (68.947 kg) IBW/kg (Calculated) : 77.6 Heparin Dosing Weight: 69 kg  Vital Signs: BP: 140/57 mmHg (07/19 0700) Pulse Rate: 48 (07/19 0700)  Labs:  Recent Labs  02/07/16 1329  HGB 15.2  HCT 44.4  PLT 119*  CREATININE 0.97  TROPONINI 0.03*   Estimated Creatinine Clearance: 59.2 mL/min (by C-G formula based on Cr of 0.97).  Medical History: Past Medical History  Diagnosis Date  . Aortic stenosis   . Pulmonary fibrosis ([place])   . Allergy   . Hypertension   . Cancer [facility])     prostate  . Hyperlipidemia   . Arthritis   . History of kidney stones   . GERD (gastroesophageal reflux disease)     Assessment: Pharmacy consulted to dose and monitor heparin drip in this 82 year old male for chest pain/ACS. I spoke with MD to confirm that patient was not taking any anticoagulants prior to admission.   Baseline labs ordered   Goal of Therapy:  Heparin level 0.3-0.7 units/ml Monitor platelets by anticoagulation protocol: Yes   Plan:  Give 4000 units bolus x 1 Start heparin infusion at 800 units/hr (~12 units/kg/hr) Check anti-Xa level in 8 hours and daily while on heparin Continue to monitor H&H and platelets  Lenis Noon, PharmD, BCPS Clinical Pharmacist 02/08/2016,8:13 AM

## 2016-02-08 NOTE — ED Provider Notes (Addendum)
[facility] is currently on diversion and will not have a bed anytime soon. I have discussed with Dr. Clayborn Bigness who recommends admission here. We will place on low dose heparin, patient is agreeable to being admitted here for further evaluation.  Earleen Newport, MD 02/08/16 5800  Earleen Newport, MD 02/08/16 [phone]

## 2016-02-08 NOTE — Progress Notes (Signed)
Patient transferred to [facility] via [facility], all belonging sent with patient, wife and daughter given directions to [facility].  VS stable, patient consented to transfer, stated he "was ready to get on with it."

## 2016-02-08 NOTE — Discharge Summary (Signed)
[place] [facility] [place] NAME: Andrew Ali    MR#:  [id number]  DATE OF BIRTH:  1932/12/04  DATE OF ADMISSION:  02/07/2016 ADMITTING PHYSICIAN: Gladstone Lighter, MD  DATE OF DISCHARGE: 02/08/16  PRIMARY CARE PHYSICIAN: Golden Pop, MD    ADMISSION DIAGNOSIS:  Aortic stenosis [I35.0] Sinus bradycardia [R00.1] Exertional shortness of breath [R06.02]  DISCHARGE DIAGNOSIS:  Active Problems:   Bradycardia   SECONDARY DIAGNOSIS:   Past Medical History  Diagnosis Date  . Aortic stenosis   . Pulmonary fibrosis ([place])   . Allergy   . Hypertension   . Cancer [facility])     prostate  . Hyperlipidemia   . Arthritis   . History of kidney stones   . GERD (gastroesophageal reflux disease)     HOSPITAL COURSE:   Andrew Ali is a 82 y.o. male with a known history of Moderate aortic stenosis, symptomatic, history of pulmonary fibrosis from chronic inflammation of unknown origin not on home oxygen, hypertension, arthritis and GERD presents from cardiology office secondary to symptomatic bradycardia.  #1 Symptomatic bradycardia- BP is stable. -Transcutaneous pacer pads on -Monitor in stepdown unit. -IV heparin drip as per cardiologist recommendations. -For cardiac catheterization today here or at [facility]. Patient to go to [facility] for further intervention.  #2 [place] aortic stenosis- will need cath- and then likely to proceed with surgery - per cardiology eval  #3 HTN- on norvasc  #4 Gout- stable. On allopurinol and colchicine  #5 hyperlipidemia-continue statin  #6 history of TIAs with syncopal episodes-has been following up with neurology as outpatient. Continue low-dose Lamictal as it has been working well for him.  #7 DVT prophylaxis-continue IV heparin drip for now  DISCHARGE CONDITIONS:   Guarded  CONSULTS OBTAINED:   Cardiology consultation by Dr. Clayborn Bigness  DRUG ALLERGIES:   Allergies  Allergen  Reactions  . Ivp Dye [Iodinated Diagnostic Agents]     DISCHARGE MEDICATIONS:   Current Discharge Medication List    CONTINUE these medications which have NOT CHANGED   Details  allopurinol (ZYLOPRIM) 300 MG tablet Take 1 tablet (300 mg total) by mouth daily. Qty: 90 tablet, Refills: 4    amLODipine (NORVASC) 10 MG tablet Take 1 tablet (10 mg total) by mouth daily. Qty: 90 tablet, Refills: 4   Associated Diagnoses: Essential hypertension, benign    aspirin EC 81 MG tablet Take 81 mg by mouth daily.    atorvastatin (LIPITOR) 10 MG tablet Take 1 tablet (10 mg total) by mouth daily. Qty: 90 tablet, Refills: 2   Associated Diagnoses: Hyperlipemia    colchicine 0.6 MG tablet Take 0.6 mg by mouth daily.    esomeprazole (NEXIUM) 40 MG capsule Take 1 capsule (40 mg total) by mouth daily. Qty: 90 capsule, Refills: 4    lamoTRIgine (LAMICTAL) 25 MG tablet Take 1 tablet (25 mg total) by mouth daily. Qty: 90 tablet, Refills: 0    Multiple Vitamin (MULTIVITAMIN) tablet Take 1 tablet by mouth daily.    Omega-3 Fatty Acids (FISH OIL PO) Take by mouth.         DISCHARGE INSTRUCTIONS:   1. Being transferred to [facility]  If you experience worsening of your admission symptoms, develop shortness of breath, life threatening emergency, suicidal or homicidal thoughts you must seek medical attention immediately by calling 911 or calling your MD immediately  if symptoms less severe.  You Must read complete instructions/literature along with all the possible adverse reactions/side effects for all  the Medicines you take and that have been prescribed to you. Take any new Medicines after you have completely understood and accept all the possible adverse reactions/side effects.   Please note  You were cared for by a hospitalist during your hospital stay. If you have any questions about your discharge medications or the care you received while you were in the hospital after you are discharged, you can  call the unit and asked to speak with the hospitalist on call if the hospitalist that took care of you is not available. Once you are discharged, your primary care physician will handle any further medical issues. Please note that NO REFILLS for any discharge medications will be authorized once you are discharged, as it is imperative that you return to your primary care physician (or establish a relationship with a primary care physician if you do not have one) for your aftercare needs so that they can reassess your need for medications and monitor your lab values.    Today   CHIEF COMPLAINT:   Chief Complaint  Patient presents with  . Bradycardia    VITAL SIGNS:  Blood pressure 152/61, pulse 69, temperature 97.8 F (36.6 C), temperature source Oral, resp. rate 14, height '5\' 9"'$  (1.753 m), weight 68.493 kg (151 lb), SpO2 100 %.  I/O:   Intake/Output Summary (Last 24 hours) at 02/08/16 1223 Last data filed at 02/08/16 1012  Gross per 24 hour  Intake      0 ml  Output    625 ml  Net   -625 ml    PHYSICAL EXAMINATION:   Physical Exam  GENERAL: 82 y.o.-year-old Elderly patient lying in the bed with no acute distress.  EYES: Pupils equal, round, reactive to light and accommodation. No scleral icterus. Extraocular muscles intact.  HEENT: Head atraumatic, normocephalic. Oropharynx and nasopharynx clear.  NECK: Supple, no jugular venous distention. No thyroid enlargement, no tenderness.  LUNGS: Normal breath sounds bilaterally, no wheezing, rales,rhonchi or crepitation. No use of accessory muscles of respiration.  CARDIOVASCULAR: S1, S2 normal. No rubs, or gallops. Soft 3/6 systolic murmur is heard both in the mitral and also aortic areas ABDOMEN: Soft, nontender, nondistended. Bowel sounds present. No organomegaly or mass.  EXTREMITIES: No pedal edema, cyanosis, or clubbing.  NEUROLOGIC: Cranial nerves II through XII are intact. Muscle strength 5/5 in all extremities.  Sensation intact. Gait not checked.  PSYCHIATRIC: The patient is alert and oriented x 3.  SKIN: No obvious rash, lesion, or ulcer.   DATA REVIEW:   CBC  Recent Labs Lab 02/07/16 1329  WBC 6.4  HGB 15.2  HCT 44.4  PLT 119*    Chemistries   Recent Labs Lab 02/07/16 1329  NA 139  K 3.5  CL 107  CO2 27  GLUCOSE 108*  BUN 13  CREATININE 0.97  CALCIUM 10.0    Cardiac Enzymes  Recent Labs Lab 02/07/16 1329  TROPONINI 0.03*    Microbiology Results  Results for orders placed or performed in visit on 08/30/15  Microscopic Examination     Status: None   Collection Time: 08/30/15 11:24 AM  Result Value Ref Range Status   WBC, UA None seen 0 -  5 /hpf Final   RBC, UA 0-2 0 -  2 /hpf Final   Epithelial Cells (non renal) None seen 0 - 10 /hpf Final   Bacteria, UA Few None seen/Few Final    RADIOLOGY:  Dg Chest Portable 1 View  02/07/2016  CLINICAL DATA:  Shortness  of breath and dizziness with slow heart rate. EXAM: PORTABLE CHEST 1 VIEW COMPARISON:  CT 11/05/2011 FINDINGS: Lungs are adequately inflated without focal consolidation or effusion. There is a calcified granuloma over the left base. Mild cardiomegaly. Calcified plaque over the aortic arch. Minimal degenerative change of the spine. IMPRESSION: No acute cardiopulmonary disease. Mild cardiomegaly. Aortic atherosclerosis. Electronically Signed   By: Marin Olp M.D.   On: 02/07/2016 15:19    EKG:   Orders placed or performed during the hospital encounter of 02/07/16  . ED EKG within 10 minutes  . ED EKG within 10 minutes  . EKG 12-Lead  . EKG 12-Lead      Management plans discussed with the patient, family and they are in agreement.  CODE STATUS:  Code Status History    This patient does not have a recorded code status. Please follow your organizational policy for patients in this situation.      TOTAL TIME TAKING CARE OF THIS PATIENT: 37 minutes.    Gladstone Lighter M.D on 02/08/2016 at 12:23  PM  Between 7am to 6pm - Pager - [phone]  After 6pm go to [email or web address] - password [email or web address] [place] [facility]  Office  [phone]  CC: Primary care physician; Golden Pop, MD

## 2016-02-09 HISTORY — PX: PACEMAKER INSERTION: SHX728

## 2016-02-28 ENCOUNTER — Ambulatory Visit (INDEPENDENT_AMBULATORY_CARE_PROVIDER_SITE_OTHER): Payer: Medicare Other | Admitting: Family Medicine

## 2016-02-28 ENCOUNTER — Encounter: Payer: Self-pay | Admitting: Family Medicine

## 2016-02-28 VITALS — BP 127/77 | HR 68 | Temp 97.8°F | Ht 70.5 in | Wt 147.0 lb

## 2016-02-28 DIAGNOSIS — Z950 Presence of cardiac pacemaker: Secondary | ICD-10-CM | POA: Diagnosis not present

## 2016-02-28 DIAGNOSIS — I10 Essential (primary) hypertension: Secondary | ICD-10-CM | POA: Diagnosis not present

## 2016-02-28 DIAGNOSIS — E785 Hyperlipidemia, unspecified: Secondary | ICD-10-CM | POA: Diagnosis not present

## 2016-02-28 DIAGNOSIS — I350 Nonrheumatic aortic (valve) stenosis: Secondary | ICD-10-CM | POA: Diagnosis not present

## 2016-02-28 LAB — LP+ALT+AST PICCOLO, WAIVED
ALT (SGPT) PICCOLO, WAIVED: 31 U/L (ref 10–47)
AST (SGOT) Piccolo, Waived: 37 U/L (ref 11–38)
CHOLESTEROL PICCOLO, WAIVED: 165 mg/dL (ref <200)
Chol/HDL Ratio Piccolo,Waive: 2.5 mg/dL
HDL CHOL PICCOLO, WAIVED: 65 mg/dL (ref >59)
LDL Chol Calc Piccolo Waived: 83 mg/dL (ref <100)
Triglycerides Piccolo,Waived: 79 mg/dL (ref <150)
VLDL Chol Calc Piccolo,Waive: 16 mg/dL (ref <30)

## 2016-02-28 NOTE — Assessment & Plan Note (Signed)
The current medical regimen is effective;  continue present plan and medications.  

## 2016-02-28 NOTE — Assessment & Plan Note (Signed)
Followed  At [facility] currently asymptomatic except for some mild shortness of breath with exertion

## 2016-02-28 NOTE — Progress Notes (Signed)
BP 127/77 (BP Location: Left Arm, Patient Position: Sitting, Cuff Size: Small)   Pulse 68   Temp 97.8 F (36.6 C)   Ht 5' 10.5" (1.791 m) Comment: with shoes  Wt 147 lb (66.7 kg) Comment: with shoes  SpO2 96%   BMI 20.79 kg/m    Subjective:    Patient ID: Andrew Ali, male    DOB: 1933-03-10, 82 y.o.   MRN: [id number]  HPI: Andrew Ali is a 82 y.o. male  Chief Complaint  Patient presents with  . Hypertension  . Hyperlipidemia   doing well with blood pressure cholesterol no complaints taking medications faithfully without problems Reviewed [facility] notes on pacemaker patient doing well reviewed pacemaker surgical site which is healing well with no side effects infection bruising is resolving. Reviewed aortic stenosis which patient has which will be followed up by [facility] later this fall with repeat ultrasound and expected surgical intervention at some time probably later this year.  Relevant past medical, surgical, family and social history reviewed and updated as indicated. Interim medical history since our last visit reviewed. Allergies and medications reviewed and updated.  Review of Systems  Constitutional: Negative.   Respiratory: Positive for shortness of breath. Negative for apnea, cough, choking, chest tightness and wheezing.   Cardiovascular: Negative.  Negative for chest pain, palpitations and leg swelling.  Gastrointestinal: Negative for abdominal distention.    Per HPI unless specifically indicated above     Objective:    BP 127/77 (BP Location: Left Arm, Patient Position: Sitting, Cuff Size: Small)   Pulse 68   Temp 97.8 F (36.6 C)   Ht 5' 10.5" (1.791 m) Comment: with shoes  Wt 147 lb (66.7 kg) Comment: with shoes  SpO2 96%   BMI 20.79 kg/m   Wt Readings from Last 3 Encounters:  02/28/16 147 lb (66.7 kg)  02/08/16 151 lb (68.5 kg)  08/30/15 149 lb (67.6 kg)    Physical Exam  Constitutional: He is oriented to person, place, and time. He appears  well-developed and well-nourished. No distress.  HENT:  Head: Normocephalic and atraumatic.  Right Ear: Hearing normal.  Left Ear: Hearing normal.  Nose: Nose normal.  Eyes: Conjunctivae and lids are normal. Right eye exhibits no discharge. Left eye exhibits no discharge. No scleral icterus.  Cardiovascular: Normal rate and regular rhythm.   Murmur heard. Pulmonary/Chest: Effort normal and breath sounds normal. No respiratory distress.  Musculoskeletal: Normal range of motion. He exhibits no edema.  Neurological: He is alert and oriented to person, place, and time.  Skin: Skin is intact. No rash noted.  Psychiatric: He has a normal mood and affect. His speech is normal and behavior is normal. Judgment and thought content normal. Cognition and memory are normal.    Results for orders placed or performed during the hospital encounter of 67/59/16  Basic metabolic panel  Result Value Ref Range   Sodium 139 135 - 145 mmol/L   Potassium 3.5 3.5 - 5.1 mmol/L   Chloride 107 101 - 111 mmol/L   CO2 27 22 - 32 mmol/L   Glucose, Bld 108 (H) 65 - 99 mg/dL   BUN 13 6 - 20 mg/dL   Creatinine, Ser 0.97 0.61 - 1.24 mg/dL   Calcium 10.0 8.9 - 10.3 mg/dL   GFR calc non Af Amer >60 >60 mL/min   GFR calc Af Amer >60 >60 mL/min   Anion gap 5 5 - 15  CBC  Result Value Ref Range  WBC 6.4 3.8 - 10.6 K/uL   RBC 4.70 4.40 - 5.90 MIL/uL   Hemoglobin 15.2 13.0 - 18.0 g/dL   HCT 44.4 40.0 - 52.0 %   MCV 94.3 80.0 - 100.0 fL   MCH 32.4 26.0 - 34.0 pg   MCHC 34.3 32.0 - 36.0 g/dL   RDW 14.7 (H) 11.5 - 14.5 %   Platelets 119 (L) 150 - 440 K/uL  Troponin I  Result Value Ref Range   Troponin I 0.03 (HH) <0.03 ng/mL  Protime-INR  Result Value Ref Range   Prothrombin Time 14.2 11.4 - 15.0 seconds   INR 1.08   APTT  Result Value Ref Range   aPTT 30 24 - 36 seconds  Glucose, capillary  Result Value Ref Range   Glucose-Capillary 91 65 - 99 mg/dL      Assessment & Plan:   Problem List Items  Addressed This Visit      Cardiovascular and Mediastinum   Hypertension - Primary    The current medical regimen is effective;  continue present plan and medications.       Relevant Medications   hydrochlorothiazide (HYDRODIURIL) 12.5 MG tablet   Other Relevant Orders   LP+ALT+AST Piccolo, Waived   Basic metabolic panel   Aortic stenosis    Followed  At [facility] currently asymptomatic except for some mild shortness of breath with exertion      Relevant Medications   hydrochlorothiazide (HYDRODIURIL) 12.5 MG tablet     Other   Hyperlipidemia    The current medical regimen is effective;  continue present plan and medications.       Relevant Medications   hydrochlorothiazide (HYDRODIURIL) 12.5 MG tablet   Other Relevant Orders   LP+ALT+AST Piccolo, Waived   Basic metabolic panel   Pacemaker    Wound sites healing well       Other Visit Diagnoses   None.      Follow up plan: Return in about 6 months (around 08/30/2016) for Physical Exam.

## 2016-02-28 NOTE — Assessment & Plan Note (Signed)
Wound sites healing well

## 2016-02-29 ENCOUNTER — Encounter: Payer: Self-pay | Admitting: Family Medicine

## 2016-02-29 LAB — BASIC METABOLIC PANEL
BUN/Creatinine Ratio: 16 (ref 10–24)
BUN: 16 mg/dL (ref 8–27)
CALCIUM: 10.5 mg/dL — AB (ref 8.6–10.2)
CO2: 26 mmol/L (ref 18–29)
CREATININE: 1.02 mg/dL (ref 0.76–1.27)
Chloride: 100 mmol/L (ref 96–106)
GFR calc Af Amer: 80 mL/min/{1.73_m2} (ref >59)
GFR, EST NON AFRICAN AMERICAN: 69 mL/min/{1.73_m2} (ref >59)
GLUCOSE: 95 mg/dL (ref 65–99)
Potassium: 4.3 mmol/L (ref 3.5–5.2)
Sodium: 141 mmol/L (ref 134–144)

## 2016-03-05 DIAGNOSIS — N200 Calculus of kidney: Secondary | ICD-10-CM | POA: Diagnosis not present

## 2016-03-05 DIAGNOSIS — Z8546 Personal history of malignant neoplasm of prostate: Secondary | ICD-10-CM | POA: Diagnosis not present

## 2016-03-06 DIAGNOSIS — H25813 Combined forms of age-related cataract, bilateral: Secondary | ICD-10-CM | POA: Diagnosis not present

## 2016-03-21 DIAGNOSIS — Z1283 Encounter for screening for malignant neoplasm of skin: Secondary | ICD-10-CM | POA: Diagnosis not present

## 2016-03-21 DIAGNOSIS — Z85828 Personal history of other malignant neoplasm of skin: Secondary | ICD-10-CM | POA: Diagnosis not present

## 2016-03-21 DIAGNOSIS — L821 Other seborrheic keratosis: Secondary | ICD-10-CM | POA: Diagnosis not present

## 2016-03-21 DIAGNOSIS — D229 Melanocytic nevi, unspecified: Secondary | ICD-10-CM | POA: Diagnosis not present

## 2016-03-21 DIAGNOSIS — L812 Freckles: Secondary | ICD-10-CM | POA: Diagnosis not present

## 2016-03-21 DIAGNOSIS — L578 Other skin changes due to chronic exposure to nonionizing radiation: Secondary | ICD-10-CM | POA: Diagnosis not present

## 2016-03-21 DIAGNOSIS — D485 Neoplasm of uncertain behavior of skin: Secondary | ICD-10-CM | POA: Diagnosis not present

## 2016-03-21 DIAGNOSIS — L820 Inflamed seborrheic keratosis: Secondary | ICD-10-CM | POA: Diagnosis not present

## 2016-03-22 DIAGNOSIS — Z45010 Encounter for checking and testing of cardiac pacemaker pulse generator [battery]: Secondary | ICD-10-CM | POA: Diagnosis not present

## 2016-03-22 DIAGNOSIS — I441 Atrioventricular block, second degree: Secondary | ICD-10-CM | POA: Diagnosis not present

## 2016-03-22 DIAGNOSIS — Z45018 Encounter for adjustment and management of other part of cardiac pacemaker: Secondary | ICD-10-CM | POA: Diagnosis not present

## 2016-03-22 DIAGNOSIS — Z950 Presence of cardiac pacemaker: Secondary | ICD-10-CM | POA: Diagnosis not present

## 2016-03-29 DIAGNOSIS — I350 Nonrheumatic aortic (valve) stenosis: Secondary | ICD-10-CM | POA: Diagnosis not present

## 2016-04-10 ENCOUNTER — Ambulatory Visit (INDEPENDENT_AMBULATORY_CARE_PROVIDER_SITE_OTHER): Payer: Medicare Other

## 2016-04-10 DIAGNOSIS — Z23 Encounter for immunization: Secondary | ICD-10-CM | POA: Diagnosis not present

## 2016-04-26 DIAGNOSIS — R310 Gross hematuria: Secondary | ICD-10-CM | POA: Diagnosis not present

## 2016-05-07 DIAGNOSIS — N2882 Megaloureter: Secondary | ICD-10-CM | POA: Diagnosis not present

## 2016-05-07 DIAGNOSIS — N200 Calculus of kidney: Secondary | ICD-10-CM | POA: Diagnosis not present

## 2016-05-07 DIAGNOSIS — K8020 Calculus of gallbladder without cholecystitis without obstruction: Secondary | ICD-10-CM | POA: Diagnosis not present

## 2016-05-07 DIAGNOSIS — N132 Hydronephrosis with renal and ureteral calculous obstruction: Secondary | ICD-10-CM | POA: Diagnosis not present

## 2016-05-24 DIAGNOSIS — R319 Hematuria, unspecified: Secondary | ICD-10-CM | POA: Diagnosis not present

## 2016-05-24 DIAGNOSIS — R310 Gross hematuria: Secondary | ICD-10-CM | POA: Diagnosis not present

## 2016-05-31 DIAGNOSIS — K5730 Diverticulosis of large intestine without perforation or abscess without bleeding: Secondary | ICD-10-CM | POA: Diagnosis not present

## 2016-05-31 DIAGNOSIS — K8020 Calculus of gallbladder without cholecystitis without obstruction: Secondary | ICD-10-CM | POA: Diagnosis not present

## 2016-05-31 DIAGNOSIS — N202 Calculus of kidney with calculus of ureter: Secondary | ICD-10-CM | POA: Diagnosis not present

## 2016-05-31 DIAGNOSIS — N200 Calculus of kidney: Secondary | ICD-10-CM | POA: Diagnosis not present

## 2016-06-05 DIAGNOSIS — H903 Sensorineural hearing loss, bilateral: Secondary | ICD-10-CM | POA: Diagnosis not present

## 2016-06-05 DIAGNOSIS — H6123 Impacted cerumen, bilateral: Secondary | ICD-10-CM | POA: Diagnosis not present

## 2016-06-21 DIAGNOSIS — Z96643 Presence of artificial hip joint, bilateral: Secondary | ICD-10-CM | POA: Diagnosis not present

## 2016-06-26 DIAGNOSIS — N1330 Unspecified hydronephrosis: Secondary | ICD-10-CM | POA: Diagnosis not present

## 2016-06-26 DIAGNOSIS — I359 Nonrheumatic aortic valve disorder, unspecified: Secondary | ICD-10-CM | POA: Diagnosis not present

## 2016-06-26 DIAGNOSIS — K219 Gastro-esophageal reflux disease without esophagitis: Secondary | ICD-10-CM | POA: Diagnosis not present

## 2016-06-26 DIAGNOSIS — R319 Hematuria, unspecified: Secondary | ICD-10-CM | POA: Diagnosis not present

## 2016-06-26 DIAGNOSIS — Z01818 Encounter for other preprocedural examination: Secondary | ICD-10-CM | POA: Diagnosis not present

## 2016-06-26 DIAGNOSIS — M109 Gout, unspecified: Secondary | ICD-10-CM | POA: Diagnosis not present

## 2016-06-26 DIAGNOSIS — I10 Essential (primary) hypertension: Secondary | ICD-10-CM | POA: Diagnosis not present

## 2016-06-26 DIAGNOSIS — R569 Unspecified convulsions: Secondary | ICD-10-CM | POA: Diagnosis not present

## 2016-06-26 DIAGNOSIS — E785 Hyperlipidemia, unspecified: Secondary | ICD-10-CM | POA: Diagnosis not present

## 2016-06-28 DIAGNOSIS — Z8679 Personal history of other diseases of the circulatory system: Secondary | ICD-10-CM | POA: Diagnosis not present

## 2016-06-28 DIAGNOSIS — Z950 Presence of cardiac pacemaker: Secondary | ICD-10-CM | POA: Diagnosis not present

## 2016-06-28 DIAGNOSIS — Z45018 Encounter for adjustment and management of other part of cardiac pacemaker: Secondary | ICD-10-CM | POA: Diagnosis not present

## 2016-06-28 DIAGNOSIS — Z79899 Other long term (current) drug therapy: Secondary | ICD-10-CM | POA: Diagnosis not present

## 2016-06-28 DIAGNOSIS — I442 Atrioventricular block, complete: Secondary | ICD-10-CM | POA: Diagnosis not present

## 2016-07-03 DIAGNOSIS — N134 Hydroureter: Secondary | ICD-10-CM | POA: Diagnosis not present

## 2016-07-03 DIAGNOSIS — R972 Elevated prostate specific antigen [PSA]: Secondary | ICD-10-CM | POA: Diagnosis not present

## 2016-07-03 DIAGNOSIS — I359 Nonrheumatic aortic valve disorder, unspecified: Secondary | ICD-10-CM | POA: Diagnosis not present

## 2016-07-03 DIAGNOSIS — J849 Interstitial pulmonary disease, unspecified: Secondary | ICD-10-CM | POA: Diagnosis not present

## 2016-07-03 DIAGNOSIS — M109 Gout, unspecified: Secondary | ICD-10-CM | POA: Diagnosis not present

## 2016-07-03 DIAGNOSIS — Z888 Allergy status to other drugs, medicaments and biological substances status: Secondary | ICD-10-CM | POA: Diagnosis not present

## 2016-07-03 DIAGNOSIS — D4959 Neoplasm of unspecified behavior of other genitourinary organ: Secondary | ICD-10-CM | POA: Diagnosis not present

## 2016-07-03 DIAGNOSIS — Z96649 Presence of unspecified artificial hip joint: Secondary | ICD-10-CM | POA: Diagnosis not present

## 2016-07-03 DIAGNOSIS — I10 Essential (primary) hypertension: Secondary | ICD-10-CM | POA: Diagnosis not present

## 2016-07-03 DIAGNOSIS — C61 Malignant neoplasm of prostate: Secondary | ICD-10-CM | POA: Diagnosis not present

## 2016-07-03 DIAGNOSIS — R935 Abnormal findings on diagnostic imaging of other abdominal regions, including retroperitoneum: Secondary | ICD-10-CM | POA: Diagnosis not present

## 2016-07-03 DIAGNOSIS — Z886 Allergy status to analgesic agent status: Secondary | ICD-10-CM | POA: Diagnosis not present

## 2016-07-03 DIAGNOSIS — C801 Malignant (primary) neoplasm, unspecified: Secondary | ICD-10-CM | POA: Diagnosis not present

## 2016-07-03 DIAGNOSIS — K219 Gastro-esophageal reflux disease without esophagitis: Secondary | ICD-10-CM | POA: Diagnosis not present

## 2016-07-03 DIAGNOSIS — Z950 Presence of cardiac pacemaker: Secondary | ICD-10-CM | POA: Diagnosis not present

## 2016-07-03 DIAGNOSIS — N132 Hydronephrosis with renal and ureteral calculous obstruction: Secondary | ICD-10-CM | POA: Diagnosis not present

## 2016-07-03 DIAGNOSIS — Z79899 Other long term (current) drug therapy: Secondary | ICD-10-CM | POA: Diagnosis not present

## 2016-07-03 DIAGNOSIS — N1330 Unspecified hydronephrosis: Secondary | ICD-10-CM | POA: Diagnosis not present

## 2016-07-03 DIAGNOSIS — M1380 Other specified arthritis, unspecified site: Secondary | ICD-10-CM | POA: Diagnosis not present

## 2016-07-03 DIAGNOSIS — Z880 Allergy status to penicillin: Secondary | ICD-10-CM | POA: Diagnosis not present

## 2016-07-09 DIAGNOSIS — R339 Retention of urine, unspecified: Secondary | ICD-10-CM | POA: Diagnosis not present

## 2016-07-09 DIAGNOSIS — R310 Gross hematuria: Secondary | ICD-10-CM | POA: Diagnosis not present

## 2016-07-12 DIAGNOSIS — Z7982 Long term (current) use of aspirin: Secondary | ICD-10-CM | POA: Diagnosis not present

## 2016-07-12 DIAGNOSIS — R310 Gross hematuria: Secondary | ICD-10-CM | POA: Diagnosis present

## 2016-07-12 DIAGNOSIS — I130 Hypertensive heart and chronic kidney disease with heart failure and stage 1 through stage 4 chronic kidney disease, or unspecified chronic kidney disease: Secondary | ICD-10-CM | POA: Diagnosis not present

## 2016-07-12 DIAGNOSIS — Z91041 Radiographic dye allergy status: Secondary | ICD-10-CM | POA: Diagnosis not present

## 2016-07-12 DIAGNOSIS — M1A9XX0 Chronic gout, unspecified, without tophus (tophi): Secondary | ICD-10-CM | POA: Diagnosis present

## 2016-07-12 DIAGNOSIS — I21A1 Myocardial infarction type 2: Secondary | ICD-10-CM | POA: Diagnosis not present

## 2016-07-12 DIAGNOSIS — N390 Urinary tract infection, site not specified: Secondary | ICD-10-CM | POA: Diagnosis not present

## 2016-07-12 DIAGNOSIS — R000 Tachycardia, unspecified: Secondary | ICD-10-CM | POA: Diagnosis not present

## 2016-07-12 DIAGNOSIS — R319 Hematuria, unspecified: Secondary | ICD-10-CM | POA: Diagnosis not present

## 2016-07-12 DIAGNOSIS — I214 Non-ST elevation (NSTEMI) myocardial infarction: Secondary | ICD-10-CM | POA: Diagnosis not present

## 2016-07-12 DIAGNOSIS — R768 Other specified abnormal immunological findings in serum: Secondary | ICD-10-CM | POA: Diagnosis not present

## 2016-07-12 DIAGNOSIS — R918 Other nonspecific abnormal finding of lung field: Secondary | ICD-10-CM | POA: Diagnosis not present

## 2016-07-12 DIAGNOSIS — K219 Gastro-esophageal reflux disease without esophagitis: Secondary | ICD-10-CM | POA: Diagnosis present

## 2016-07-12 DIAGNOSIS — I350 Nonrheumatic aortic (valve) stenosis: Secondary | ICD-10-CM | POA: Diagnosis not present

## 2016-07-12 DIAGNOSIS — I080 Rheumatic disorders of both mitral and aortic valves: Secondary | ICD-10-CM | POA: Diagnosis present

## 2016-07-12 DIAGNOSIS — I509 Heart failure, unspecified: Secondary | ICD-10-CM | POA: Diagnosis not present

## 2016-07-12 DIAGNOSIS — I2510 Atherosclerotic heart disease of native coronary artery without angina pectoris: Secondary | ICD-10-CM | POA: Diagnosis present

## 2016-07-12 DIAGNOSIS — J8410 Pulmonary fibrosis, unspecified: Secondary | ICD-10-CM | POA: Diagnosis present

## 2016-07-12 DIAGNOSIS — I340 Nonrheumatic mitral (valve) insufficiency: Secondary | ICD-10-CM | POA: Diagnosis not present

## 2016-07-12 DIAGNOSIS — I5031 Acute diastolic (congestive) heart failure: Secondary | ICD-10-CM | POA: Diagnosis not present

## 2016-07-12 DIAGNOSIS — I480 Paroxysmal atrial fibrillation: Secondary | ICD-10-CM | POA: Diagnosis not present

## 2016-07-12 DIAGNOSIS — I481 Persistent atrial fibrillation: Secondary | ICD-10-CM | POA: Diagnosis not present

## 2016-07-12 DIAGNOSIS — I2584 Coronary atherosclerosis due to calcified coronary lesion: Secondary | ICD-10-CM | POA: Diagnosis not present

## 2016-07-12 DIAGNOSIS — N189 Chronic kidney disease, unspecified: Secondary | ICD-10-CM | POA: Diagnosis present

## 2016-07-12 DIAGNOSIS — C641 Malignant neoplasm of right kidney, except renal pelvis: Secondary | ICD-10-CM | POA: Diagnosis not present

## 2016-07-12 DIAGNOSIS — C661 Malignant neoplasm of right ureter: Secondary | ICD-10-CM | POA: Diagnosis not present

## 2016-07-12 DIAGNOSIS — C3490 Malignant neoplasm of unspecified part of unspecified bronchus or lung: Secondary | ICD-10-CM | POA: Diagnosis not present

## 2016-07-12 DIAGNOSIS — R109 Unspecified abdominal pain: Secondary | ICD-10-CM | POA: Diagnosis not present

## 2016-07-12 DIAGNOSIS — C669 Malignant neoplasm of unspecified ureter: Secondary | ICD-10-CM | POA: Diagnosis not present

## 2016-07-12 DIAGNOSIS — Z950 Presence of cardiac pacemaker: Secondary | ICD-10-CM | POA: Diagnosis not present

## 2016-07-12 DIAGNOSIS — E782 Mixed hyperlipidemia: Secondary | ICD-10-CM | POA: Diagnosis present

## 2016-07-12 DIAGNOSIS — Z8673 Personal history of transient ischemic attack (TIA), and cerebral infarction without residual deficits: Secondary | ICD-10-CM | POA: Diagnosis not present

## 2016-07-12 DIAGNOSIS — R0602 Shortness of breath: Secondary | ICD-10-CM | POA: Diagnosis not present

## 2016-07-12 DIAGNOSIS — Z8546 Personal history of malignant neoplasm of prostate: Secondary | ICD-10-CM | POA: Diagnosis not present

## 2016-07-12 DIAGNOSIS — J90 Pleural effusion, not elsewhere classified: Secondary | ICD-10-CM | POA: Diagnosis not present

## 2016-07-12 DIAGNOSIS — J449 Chronic obstructive pulmonary disease, unspecified: Secondary | ICD-10-CM | POA: Diagnosis present

## 2016-07-12 DIAGNOSIS — J811 Chronic pulmonary edema: Secondary | ICD-10-CM | POA: Diagnosis not present

## 2016-07-13 DIAGNOSIS — R768 Other specified abnormal immunological findings in serum: Secondary | ICD-10-CM | POA: Diagnosis not present

## 2016-07-19 DIAGNOSIS — C661 Malignant neoplasm of right ureter: Secondary | ICD-10-CM | POA: Diagnosis not present

## 2016-07-19 DIAGNOSIS — C61 Malignant neoplasm of prostate: Secondary | ICD-10-CM | POA: Diagnosis not present

## 2016-08-02 DIAGNOSIS — C661 Malignant neoplasm of right ureter: Secondary | ICD-10-CM | POA: Diagnosis not present

## 2016-08-02 DIAGNOSIS — I509 Heart failure, unspecified: Secondary | ICD-10-CM | POA: Diagnosis not present

## 2016-08-02 DIAGNOSIS — Z8546 Personal history of malignant neoplasm of prostate: Secondary | ICD-10-CM | POA: Diagnosis not present

## 2016-08-02 DIAGNOSIS — C7A8 Other malignant neuroendocrine tumors: Secondary | ICD-10-CM | POA: Diagnosis not present

## 2016-08-02 DIAGNOSIS — Z923 Personal history of irradiation: Secondary | ICD-10-CM | POA: Diagnosis not present

## 2016-08-02 DIAGNOSIS — R0602 Shortness of breath: Secondary | ICD-10-CM | POA: Diagnosis not present

## 2016-08-02 DIAGNOSIS — I350 Nonrheumatic aortic (valve) stenosis: Secondary | ICD-10-CM | POA: Diagnosis not present

## 2016-08-02 DIAGNOSIS — R319 Hematuria, unspecified: Secondary | ICD-10-CM | POA: Diagnosis not present

## 2016-08-02 DIAGNOSIS — J811 Chronic pulmonary edema: Secondary | ICD-10-CM | POA: Diagnosis not present

## 2016-08-02 DIAGNOSIS — J90 Pleural effusion, not elsewhere classified: Secondary | ICD-10-CM | POA: Diagnosis not present

## 2016-08-13 DIAGNOSIS — R918 Other nonspecific abnormal finding of lung field: Secondary | ICD-10-CM | POA: Diagnosis not present

## 2016-08-13 DIAGNOSIS — C7A8 Other malignant neuroendocrine tumors: Secondary | ICD-10-CM | POA: Diagnosis not present

## 2016-08-13 DIAGNOSIS — J90 Pleural effusion, not elsewhere classified: Secondary | ICD-10-CM | POA: Diagnosis not present

## 2016-08-20 DIAGNOSIS — I350 Nonrheumatic aortic (valve) stenosis: Secondary | ICD-10-CM | POA: Diagnosis not present

## 2016-08-29 DIAGNOSIS — C7A8 Other malignant neuroendocrine tumors: Secondary | ICD-10-CM | POA: Diagnosis not present

## 2016-08-29 DIAGNOSIS — C661 Malignant neoplasm of right ureter: Secondary | ICD-10-CM | POA: Diagnosis not present

## 2016-08-30 ENCOUNTER — Ambulatory Visit (INDEPENDENT_AMBULATORY_CARE_PROVIDER_SITE_OTHER): Payer: Medicare Other | Admitting: Family Medicine

## 2016-08-30 ENCOUNTER — Encounter: Payer: Self-pay | Admitting: Family Medicine

## 2016-08-30 VITALS — BP 100/66 | HR 86 | Ht 70.67 in | Wt 143.0 lb

## 2016-08-30 DIAGNOSIS — R2990 Unspecified symptoms and signs involving the nervous system: Secondary | ICD-10-CM

## 2016-08-30 DIAGNOSIS — I10 Essential (primary) hypertension: Secondary | ICD-10-CM

## 2016-08-30 DIAGNOSIS — E785 Hyperlipidemia, unspecified: Secondary | ICD-10-CM | POA: Diagnosis not present

## 2016-08-30 DIAGNOSIS — E7800 Pure hypercholesterolemia, unspecified: Secondary | ICD-10-CM | POA: Diagnosis not present

## 2016-08-30 DIAGNOSIS — I350 Nonrheumatic aortic (valve) stenosis: Secondary | ICD-10-CM | POA: Diagnosis not present

## 2016-08-30 DIAGNOSIS — Z23 Encounter for immunization: Secondary | ICD-10-CM | POA: Diagnosis not present

## 2016-08-30 DIAGNOSIS — Z1329 Encounter for screening for other suspected endocrine disorder: Secondary | ICD-10-CM | POA: Diagnosis not present

## 2016-08-30 DIAGNOSIS — Z125 Encounter for screening for malignant neoplasm of prostate: Secondary | ICD-10-CM

## 2016-08-30 DIAGNOSIS — Z0000 Encounter for general adult medical examination without abnormal findings: Secondary | ICD-10-CM

## 2016-08-30 MED ORDER — LAMOTRIGINE 25 MG PO TABS: 25.0000 mg | ORAL_TABLET | Freq: Every day | ORAL | 4 refills | Status: DC

## 2016-08-30 MED ORDER — AMOXICILLIN 875 MG PO TABS: 875.0000 mg | ORAL_TABLET | Freq: Two times a day (BID) | ORAL | 3 refills | Status: DC

## 2016-08-30 MED ORDER — METOPROLOL TARTRATE 25 MG PO TABS: 12.5000 mg | ORAL_TABLET | Freq: Three times a day (TID) | ORAL | 4 refills | Status: DC

## 2016-08-30 MED ORDER — FUROSEMIDE 40 MG PO TABS: 40.0000 mg | ORAL_TABLET | Freq: Two times a day (BID) | ORAL | 4 refills | Status: DC

## 2016-08-30 MED ORDER — ESOMEPRAZOLE MAGNESIUM 40 MG PO CPDR: 40.0000 mg | DELAYED_RELEASE_CAPSULE | Freq: Every day | ORAL | 4 refills | Status: DC

## 2016-08-30 MED ORDER — ALLOPURINOL 300 MG PO TABS: 300.0000 mg | ORAL_TABLET | Freq: Every day | ORAL | 4 refills | Status: DC

## 2016-08-30 MED ORDER — ATORVASTATIN CALCIUM 40 MG PO TABS: 40.0000 mg | ORAL_TABLET | Freq: Every day | ORAL | 4 refills | Status: DC

## 2016-08-30 NOTE — Assessment & Plan Note (Signed)
Stable with significant medicine changes and med list updated

## 2016-08-30 NOTE — Progress Notes (Signed)
BP 100/66   Pulse 86   Ht 5' 10.67" (1.795 m)   Wt 143 lb (64.9 kg)   SpO2 95%   BMI 20.13 kg/m    Subjective:    Patient ID: Andrew Ali, male    DOB: [birth year], 83 y.o.   MRN: [id number]  HPI: Andrew Ali is a 83 y.o. male  Chief Complaint  Patient presents with  . Annual Exam  AWV metrics met  Patient with complicated medical history with new events reviewed [facility] records. Patient pending in a few days aortic valve replacement surgery for profound aortic stenosis with marked CHF symptoms. 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 Patient's had extensive workup and imaging and blood work. I was able to review his blood work done from this summer on and no further blood work for his physical is needed to be done here.  Social factors include concerned over his wife who is unable to do self-care which the patient had been a caregiver for. She is now in pain nursing home and doing fair at best.  Patient is accompanied by his grandson who assists with his care and Dr. visits.  Relevant past medical, surgical, family and social history reviewed and updated as indicated. Interim medical history since our last visit reviewed. Allergies and medications reviewed and updated.  Review of systems is largely positive but excluding above conditions his review of systems is essentially normal. Review of Systems  Constitutional: Positive for activity change, appetite change and fatigue. Negative for chills, diaphoresis and fever.  HENT: Negative.   Eyes: Negative.   Respiratory: Positive for shortness of breath and wheezing.   Cardiovascular: Positive for chest pain, palpitations and leg swelling.  Gastrointestinal: Negative.   Endocrine: Negative.   Genitourinary: Negative.   Musculoskeletal: Negative.   Skin: Negative.   Allergic/Immunologic: Negative.   Neurological: Negative.   Hematological:  Negative.   Psychiatric/Behavioral: Negative.     Per HPI unless specifically indicated above     Objective:    BP 100/66   Pulse 86   Ht 5' 10.67" (1.795 m)   Wt 143 lb (64.9 kg)   SpO2 95%   BMI 20.13 kg/m   Wt Readings from Last 3 Encounters:  08/30/16 143 lb (64.9 kg)  02/28/16 147 lb (66.7 kg)  02/08/16 151 lb (68.5 kg)    Physical Exam  Constitutional: He is oriented to person, place, and time. He appears well-developed. He appears distressed.  HENT:  Head: Normocephalic and atraumatic.  Right Ear: Hearing normal.  Left Ear: Hearing normal.  Nose: Nose normal.  Eyes: Conjunctivae and lids are normal. Right eye exhibits no discharge. Left eye exhibits no discharge. No scleral icterus.  Pulmonary/Chest: He is in respiratory distress. He has no wheezes.  Musculoskeletal: Normal range of motion.  Neurological: He is alert and oriented to person, place, and time.  Skin: Skin is intact. No rash noted.  Psychiatric: He has a normal mood and affect. His speech is normal and behavior is normal. Judgment and thought content normal. Cognition and memory are normal.    Results for orders placed or performed in visit on 02/28/16  LP+ALT+AST Piccolo, [employer]  Result Value Ref Range   ALT (SGPT) Piccolo, Waived 31 10 - 47 U/L   AST (SGOT) Piccolo, Waived 37 11 - 38 U/L   Cholesterol Piccolo, Waived 165 <200 mg/dL   HDL Chol Piccolo,  Waived 65 >59 mg/dL   Triglycerides Piccolo,Waived 79 <150 mg/dL   Chol/HDL Ratio Piccolo,Waive 2.5 mg/dL   LDL Chol Calc Piccolo Waived 83 <100 mg/dL   VLDL Chol Calc Piccolo,Waive 16 <30 mg/dL  Basic metabolic panel  Result Value Ref Range   Glucose 95 65 - 99 mg/dL   BUN 16 8 - 27 mg/dL   Creatinine, Ser 1.02 0.76 - 1.27 mg/dL   GFR calc non Af Amer 69 >59 mL/min/1.73   GFR calc Af Amer 80 >59 mL/min/1.73   BUN/Creatinine Ratio 16 10 - 24   Sodium 141 134 - 144 mmol/L   Potassium 4.3 3.5 - 5.2 mmol/L   Chloride 100 96 - 106 mmol/L   CO2  26 18 - 29 mmol/L   Calcium 10.5 (H) 8.6 - 10.2 mg/dL      Assessment & Plan:   Problem List Items Addressed This Visit      Cardiovascular and Mediastinum   Hypertension    Stable with significant medicine changes and med list updated      Relevant Medications   atorvastatin (LIPITOR) 40 MG tablet   furosemide (LASIX) 40 MG tablet   metoprolol tartrate (LOPRESSOR) 25 MG tablet   Aortic stenosis    Pending surgery      Relevant Medications   atorvastatin (LIPITOR) 40 MG tablet   furosemide (LASIX) 40 MG tablet   metoprolol tartrate (LOPRESSOR) 25 MG tablet     Other   Hyperlipidemia   Relevant Medications   atorvastatin (LIPITOR) 40 MG tablet   furosemide (LASIX) 40 MG tablet   metoprolol tartrate (LOPRESSOR) 25 MG tablet   Episode of transient neurologic symptoms    Stable on lamictal       Other Visit Diagnoses    Annual physical exam    -  Primary   Need for Zostavax administration       Need for Tdap vaccination       Prostate cancer screening       Thyroid disorder screen           Follow up plan: Return in about 6 months (around 03/12/2017) for BMP,  Lipids, ALT, AST.

## 2016-08-30 NOTE — Assessment & Plan Note (Signed)
Pending surgery

## 2016-08-30 NOTE — Assessment & Plan Note (Signed)
Stable on lamictal

## 2016-09-02 DIAGNOSIS — R918 Other nonspecific abnormal finding of lung field: Secondary | ICD-10-CM | POA: Diagnosis not present

## 2016-09-02 DIAGNOSIS — N1330 Unspecified hydronephrosis: Secondary | ICD-10-CM | POA: Diagnosis not present

## 2016-09-02 DIAGNOSIS — I340 Nonrheumatic mitral (valve) insufficiency: Secondary | ICD-10-CM | POA: Diagnosis not present

## 2016-09-02 DIAGNOSIS — I272 Pulmonary hypertension, unspecified: Secondary | ICD-10-CM | POA: Diagnosis present

## 2016-09-02 DIAGNOSIS — Z96641 Presence of right artificial hip joint: Secondary | ICD-10-CM | POA: Diagnosis present

## 2016-09-02 DIAGNOSIS — J90 Pleural effusion, not elsewhere classified: Secondary | ICD-10-CM | POA: Diagnosis not present

## 2016-09-02 DIAGNOSIS — Z7982 Long term (current) use of aspirin: Secondary | ICD-10-CM | POA: Diagnosis not present

## 2016-09-02 DIAGNOSIS — I341 Nonrheumatic mitral (valve) prolapse: Secondary | ICD-10-CM | POA: Diagnosis present

## 2016-09-02 DIAGNOSIS — I509 Heart failure, unspecified: Secondary | ICD-10-CM | POA: Diagnosis present

## 2016-09-02 DIAGNOSIS — M1A9XX0 Chronic gout, unspecified, without tophus (tophi): Secondary | ICD-10-CM | POA: Diagnosis present

## 2016-09-02 DIAGNOSIS — J449 Chronic obstructive pulmonary disease, unspecified: Secondary | ICD-10-CM | POA: Diagnosis present

## 2016-09-02 DIAGNOSIS — I2510 Atherosclerotic heart disease of native coronary artery without angina pectoris: Secondary | ICD-10-CM | POA: Diagnosis present

## 2016-09-02 DIAGNOSIS — I110 Hypertensive heart disease with heart failure: Secondary | ICD-10-CM | POA: Diagnosis present

## 2016-09-02 DIAGNOSIS — Z950 Presence of cardiac pacemaker: Secondary | ICD-10-CM | POA: Diagnosis not present

## 2016-09-02 DIAGNOSIS — R768 Other specified abnormal immunological findings in serum: Secondary | ICD-10-CM | POA: Diagnosis not present

## 2016-09-02 DIAGNOSIS — E782 Mixed hyperlipidemia: Secondary | ICD-10-CM | POA: Diagnosis present

## 2016-09-02 DIAGNOSIS — K219 Gastro-esophageal reflux disease without esophagitis: Secondary | ICD-10-CM | POA: Diagnosis present

## 2016-09-02 DIAGNOSIS — J9811 Atelectasis: Secondary | ICD-10-CM | POA: Diagnosis not present

## 2016-09-02 DIAGNOSIS — Z952 Presence of prosthetic heart valve: Secondary | ICD-10-CM | POA: Diagnosis not present

## 2016-09-02 DIAGNOSIS — J811 Chronic pulmonary edema: Secondary | ICD-10-CM | POA: Diagnosis not present

## 2016-09-02 DIAGNOSIS — C669 Malignant neoplasm of unspecified ureter: Secondary | ICD-10-CM | POA: Diagnosis present

## 2016-09-02 DIAGNOSIS — I350 Nonrheumatic aortic (valve) stenosis: Secondary | ICD-10-CM | POA: Diagnosis not present

## 2016-09-02 DIAGNOSIS — Z8546 Personal history of malignant neoplasm of prostate: Secondary | ICD-10-CM | POA: Diagnosis not present

## 2016-09-02 DIAGNOSIS — R0600 Dyspnea, unspecified: Secondary | ICD-10-CM | POA: Diagnosis not present

## 2016-09-02 DIAGNOSIS — Z923 Personal history of irradiation: Secondary | ICD-10-CM | POA: Diagnosis not present

## 2016-09-02 DIAGNOSIS — Z8673 Personal history of transient ischemic attack (TIA), and cerebral infarction without residual deficits: Secondary | ICD-10-CM | POA: Diagnosis not present

## 2016-09-06 DIAGNOSIS — I110 Hypertensive heart disease with heart failure: Secondary | ICD-10-CM | POA: Diagnosis not present

## 2016-09-06 DIAGNOSIS — I080 Rheumatic disorders of both mitral and aortic valves: Secondary | ICD-10-CM | POA: Diagnosis not present

## 2016-09-07 DIAGNOSIS — I080 Rheumatic disorders of both mitral and aortic valves: Secondary | ICD-10-CM | POA: Diagnosis not present

## 2016-09-07 DIAGNOSIS — I110 Hypertensive heart disease with heart failure: Secondary | ICD-10-CM | POA: Diagnosis not present

## 2016-09-11 DIAGNOSIS — I110 Hypertensive heart disease with heart failure: Secondary | ICD-10-CM | POA: Diagnosis not present

## 2016-09-11 DIAGNOSIS — I080 Rheumatic disorders of both mitral and aortic valves: Secondary | ICD-10-CM | POA: Diagnosis not present

## 2016-09-13 DIAGNOSIS — I080 Rheumatic disorders of both mitral and aortic valves: Secondary | ICD-10-CM | POA: Diagnosis not present

## 2016-09-13 DIAGNOSIS — I110 Hypertensive heart disease with heart failure: Secondary | ICD-10-CM | POA: Diagnosis not present

## 2016-09-18 DIAGNOSIS — R498 Other voice and resonance disorders: Secondary | ICD-10-CM | POA: Diagnosis not present

## 2016-09-18 DIAGNOSIS — I110 Hypertensive heart disease with heart failure: Secondary | ICD-10-CM | POA: Diagnosis present

## 2016-09-18 DIAGNOSIS — R310 Gross hematuria: Secondary | ICD-10-CM | POA: Diagnosis not present

## 2016-09-18 DIAGNOSIS — J90 Pleural effusion, not elsewhere classified: Secondary | ICD-10-CM | POA: Diagnosis not present

## 2016-09-18 DIAGNOSIS — Z7902 Long term (current) use of antithrombotics/antiplatelets: Secondary | ICD-10-CM | POA: Diagnosis not present

## 2016-09-18 DIAGNOSIS — N390 Urinary tract infection, site not specified: Secondary | ICD-10-CM | POA: Diagnosis present

## 2016-09-18 DIAGNOSIS — J811 Chronic pulmonary edema: Secondary | ICD-10-CM | POA: Diagnosis not present

## 2016-09-18 DIAGNOSIS — R768 Other specified abnormal immunological findings in serum: Secondary | ICD-10-CM | POA: Diagnosis not present

## 2016-09-18 DIAGNOSIS — E785 Hyperlipidemia, unspecified: Secondary | ICD-10-CM | POA: Diagnosis not present

## 2016-09-18 DIAGNOSIS — I060 Rheumatic aortic stenosis: Secondary | ICD-10-CM | POA: Diagnosis present

## 2016-09-18 DIAGNOSIS — J9811 Atelectasis: Secondary | ICD-10-CM | POA: Diagnosis not present

## 2016-09-18 DIAGNOSIS — R319 Hematuria, unspecified: Secondary | ICD-10-CM | POA: Diagnosis not present

## 2016-09-18 DIAGNOSIS — R52 Pain, unspecified: Secondary | ICD-10-CM | POA: Diagnosis not present

## 2016-09-18 DIAGNOSIS — M6281 Muscle weakness (generalized): Secondary | ICD-10-CM | POA: Diagnosis not present

## 2016-09-18 DIAGNOSIS — Z006 Encounter for examination for normal comparison and control in clinical research program: Secondary | ICD-10-CM | POA: Diagnosis not present

## 2016-09-18 DIAGNOSIS — K219 Gastro-esophageal reflux disease without esophagitis: Secondary | ICD-10-CM | POA: Diagnosis present

## 2016-09-18 DIAGNOSIS — E876 Hypokalemia: Secondary | ICD-10-CM | POA: Diagnosis not present

## 2016-09-18 DIAGNOSIS — T457X1A Poisoning by anticoagulant antagonists, vitamin K and other coagulants, accidental (unintentional), initial encounter: Secondary | ICD-10-CM | POA: Diagnosis not present

## 2016-09-18 DIAGNOSIS — N132 Hydronephrosis with renal and ureteral calculous obstruction: Secondary | ICD-10-CM | POA: Diagnosis present

## 2016-09-18 DIAGNOSIS — R609 Edema, unspecified: Secondary | ICD-10-CM | POA: Diagnosis not present

## 2016-09-18 DIAGNOSIS — G8918 Other acute postprocedural pain: Secondary | ICD-10-CM | POA: Diagnosis not present

## 2016-09-18 DIAGNOSIS — N3289 Other specified disorders of bladder: Secondary | ICD-10-CM | POA: Diagnosis not present

## 2016-09-18 DIAGNOSIS — C61 Malignant neoplasm of prostate: Secondary | ICD-10-CM | POA: Diagnosis present

## 2016-09-18 DIAGNOSIS — I350 Nonrheumatic aortic (valve) stenosis: Secondary | ICD-10-CM | POA: Diagnosis not present

## 2016-09-18 DIAGNOSIS — C661 Malignant neoplasm of right ureter: Secondary | ICD-10-CM | POA: Diagnosis present

## 2016-09-18 DIAGNOSIS — Z96641 Presence of right artificial hip joint: Secondary | ICD-10-CM | POA: Diagnosis present

## 2016-09-18 DIAGNOSIS — Z8673 Personal history of transient ischemic attack (TIA), and cerebral infarction without residual deficits: Secondary | ICD-10-CM | POA: Diagnosis not present

## 2016-09-18 DIAGNOSIS — D689 Coagulation defect, unspecified: Secondary | ICD-10-CM | POA: Diagnosis not present

## 2016-09-18 DIAGNOSIS — Z789 Other specified health status: Secondary | ICD-10-CM | POA: Diagnosis not present

## 2016-09-18 DIAGNOSIS — R918 Other nonspecific abnormal finding of lung field: Secondary | ICD-10-CM | POA: Diagnosis not present

## 2016-09-18 DIAGNOSIS — G40909 Epilepsy, unspecified, not intractable, without status epilepticus: Secondary | ICD-10-CM | POA: Diagnosis present

## 2016-09-18 DIAGNOSIS — E782 Mixed hyperlipidemia: Secondary | ICD-10-CM | POA: Diagnosis present

## 2016-09-18 DIAGNOSIS — J8410 Pulmonary fibrosis, unspecified: Secondary | ICD-10-CM | POA: Diagnosis present

## 2016-09-18 DIAGNOSIS — I25710 Atherosclerosis of autologous vein coronary artery bypass graft(s) with unstable angina pectoris: Secondary | ICD-10-CM | POA: Diagnosis not present

## 2016-09-18 DIAGNOSIS — I5032 Chronic diastolic (congestive) heart failure: Secondary | ICD-10-CM | POA: Diagnosis present

## 2016-09-18 DIAGNOSIS — D696 Thrombocytopenia, unspecified: Secondary | ICD-10-CM | POA: Diagnosis not present

## 2016-09-18 DIAGNOSIS — I2510 Atherosclerotic heart disease of native coronary artery without angina pectoris: Secondary | ICD-10-CM | POA: Diagnosis present

## 2016-09-18 DIAGNOSIS — Z923 Personal history of irradiation: Secondary | ICD-10-CM | POA: Diagnosis not present

## 2016-09-18 DIAGNOSIS — E569 Vitamin deficiency, unspecified: Secondary | ICD-10-CM | POA: Diagnosis not present

## 2016-09-18 DIAGNOSIS — Z7982 Long term (current) use of aspirin: Secondary | ICD-10-CM | POA: Diagnosis not present

## 2016-09-18 DIAGNOSIS — I5020 Unspecified systolic (congestive) heart failure: Secondary | ICD-10-CM | POA: Diagnosis not present

## 2016-09-18 DIAGNOSIS — R2681 Unsteadiness on feet: Secondary | ICD-10-CM | POA: Diagnosis not present

## 2016-09-18 DIAGNOSIS — J449 Chronic obstructive pulmonary disease, unspecified: Secondary | ICD-10-CM | POA: Diagnosis present

## 2016-09-18 DIAGNOSIS — Z950 Presence of cardiac pacemaker: Secondary | ICD-10-CM | POA: Diagnosis not present

## 2016-09-18 DIAGNOSIS — Z5189 Encounter for other specified aftercare: Secondary | ICD-10-CM | POA: Diagnosis not present

## 2016-09-18 DIAGNOSIS — M1A9XX0 Chronic gout, unspecified, without tophus (tophi): Secondary | ICD-10-CM | POA: Diagnosis present

## 2016-09-18 DIAGNOSIS — I517 Cardiomegaly: Secondary | ICD-10-CM | POA: Diagnosis not present

## 2016-09-18 DIAGNOSIS — Z79899 Other long term (current) drug therapy: Secondary | ICD-10-CM | POA: Diagnosis not present

## 2016-09-21 DIAGNOSIS — C661 Malignant neoplasm of right ureter: Secondary | ICD-10-CM | POA: Diagnosis not present

## 2016-09-25 DIAGNOSIS — Z950 Presence of cardiac pacemaker: Secondary | ICD-10-CM | POA: Diagnosis not present

## 2016-09-25 DIAGNOSIS — Z5189 Encounter for other specified aftercare: Secondary | ICD-10-CM | POA: Diagnosis not present

## 2016-09-25 DIAGNOSIS — M109 Gout, unspecified: Secondary | ICD-10-CM | POA: Diagnosis not present

## 2016-09-25 DIAGNOSIS — I5032 Chronic diastolic (congestive) heart failure: Secondary | ICD-10-CM | POA: Diagnosis not present

## 2016-09-25 DIAGNOSIS — H25813 Combined forms of age-related cataract, bilateral: Secondary | ICD-10-CM | POA: Diagnosis not present

## 2016-09-25 DIAGNOSIS — I509 Heart failure, unspecified: Secondary | ICD-10-CM | POA: Diagnosis not present

## 2016-09-25 DIAGNOSIS — C661 Malignant neoplasm of right ureter: Secondary | ICD-10-CM | POA: Diagnosis not present

## 2016-09-25 DIAGNOSIS — E569 Vitamin deficiency, unspecified: Secondary | ICD-10-CM | POA: Diagnosis not present

## 2016-09-25 DIAGNOSIS — R0602 Shortness of breath: Secondary | ICD-10-CM | POA: Diagnosis not present

## 2016-09-25 DIAGNOSIS — I10 Essential (primary) hypertension: Secondary | ICD-10-CM | POA: Diagnosis not present

## 2016-09-25 DIAGNOSIS — R609 Edema, unspecified: Secondary | ICD-10-CM | POA: Diagnosis not present

## 2016-09-25 DIAGNOSIS — R498 Other voice and resonance disorders: Secondary | ICD-10-CM | POA: Diagnosis not present

## 2016-09-25 DIAGNOSIS — L97519 Non-pressure chronic ulcer of other part of right foot with unspecified severity: Secondary | ICD-10-CM | POA: Diagnosis not present

## 2016-09-25 DIAGNOSIS — I25710 Atherosclerosis of autologous vein coronary artery bypass graft(s) with unstable angina pectoris: Secondary | ICD-10-CM | POA: Diagnosis not present

## 2016-09-25 DIAGNOSIS — Z789 Other specified health status: Secondary | ICD-10-CM | POA: Diagnosis not present

## 2016-09-25 DIAGNOSIS — I051 Rheumatic mitral insufficiency: Secondary | ICD-10-CM | POA: Diagnosis not present

## 2016-09-25 DIAGNOSIS — E785 Hyperlipidemia, unspecified: Secondary | ICD-10-CM | POA: Diagnosis not present

## 2016-09-25 DIAGNOSIS — R350 Frequency of micturition: Secondary | ICD-10-CM | POA: Diagnosis not present

## 2016-09-25 DIAGNOSIS — H524 Presbyopia: Secondary | ICD-10-CM | POA: Diagnosis not present

## 2016-09-25 DIAGNOSIS — R2681 Unsteadiness on feet: Secondary | ICD-10-CM | POA: Diagnosis not present

## 2016-09-25 DIAGNOSIS — R52 Pain, unspecified: Secondary | ICD-10-CM | POA: Diagnosis not present

## 2016-09-25 DIAGNOSIS — E876 Hypokalemia: Secondary | ICD-10-CM | POA: Diagnosis not present

## 2016-09-25 DIAGNOSIS — R600 Localized edema: Secondary | ICD-10-CM | POA: Diagnosis not present

## 2016-09-25 DIAGNOSIS — M1A9XX0 Chronic gout, unspecified, without tophus (tophi): Secondary | ICD-10-CM | POA: Diagnosis not present

## 2016-09-25 DIAGNOSIS — H353111 Nonexudative age-related macular degeneration, right eye, early dry stage: Secondary | ICD-10-CM | POA: Diagnosis not present

## 2016-09-25 DIAGNOSIS — R601 Generalized edema: Secondary | ICD-10-CM | POA: Diagnosis not present

## 2016-09-25 DIAGNOSIS — Z952 Presence of prosthetic heart valve: Secondary | ICD-10-CM | POA: Diagnosis not present

## 2016-09-25 DIAGNOSIS — Z510 Encounter for antineoplastic radiation therapy: Secondary | ICD-10-CM | POA: Diagnosis not present

## 2016-09-25 DIAGNOSIS — M6281 Muscle weakness (generalized): Secondary | ICD-10-CM | POA: Diagnosis not present

## 2016-09-25 DIAGNOSIS — R569 Unspecified convulsions: Secondary | ICD-10-CM | POA: Diagnosis not present

## 2016-09-25 DIAGNOSIS — L97529 Non-pressure chronic ulcer of other part of left foot with unspecified severity: Secondary | ICD-10-CM | POA: Diagnosis not present

## 2016-09-25 DIAGNOSIS — I5020 Unspecified systolic (congestive) heart failure: Secondary | ICD-10-CM | POA: Diagnosis not present

## 2016-09-25 DIAGNOSIS — R319 Hematuria, unspecified: Secondary | ICD-10-CM | POA: Diagnosis not present

## 2016-09-25 DIAGNOSIS — K219 Gastro-esophageal reflux disease without esophagitis: Secondary | ICD-10-CM | POA: Diagnosis not present

## 2016-09-26 DIAGNOSIS — R319 Hematuria, unspecified: Secondary | ICD-10-CM | POA: Diagnosis not present

## 2016-09-26 DIAGNOSIS — R350 Frequency of micturition: Secondary | ICD-10-CM | POA: Diagnosis not present

## 2016-09-26 DIAGNOSIS — R600 Localized edema: Secondary | ICD-10-CM | POA: Diagnosis not present

## 2016-09-26 DIAGNOSIS — C661 Malignant neoplasm of right ureter: Secondary | ICD-10-CM | POA: Diagnosis not present

## 2016-09-26 DIAGNOSIS — Z510 Encounter for antineoplastic radiation therapy: Secondary | ICD-10-CM | POA: Diagnosis not present

## 2016-09-26 DIAGNOSIS — R0602 Shortness of breath: Secondary | ICD-10-CM | POA: Diagnosis not present

## 2016-09-27 DIAGNOSIS — R0602 Shortness of breath: Secondary | ICD-10-CM | POA: Diagnosis not present

## 2016-09-27 DIAGNOSIS — R319 Hematuria, unspecified: Secondary | ICD-10-CM | POA: Diagnosis not present

## 2016-09-27 DIAGNOSIS — R350 Frequency of micturition: Secondary | ICD-10-CM | POA: Diagnosis not present

## 2016-09-27 DIAGNOSIS — M109 Gout, unspecified: Secondary | ICD-10-CM | POA: Diagnosis not present

## 2016-09-27 DIAGNOSIS — Z952 Presence of prosthetic heart valve: Secondary | ICD-10-CM | POA: Diagnosis not present

## 2016-09-27 DIAGNOSIS — M6281 Muscle weakness (generalized): Secondary | ICD-10-CM | POA: Diagnosis not present

## 2016-09-27 DIAGNOSIS — K219 Gastro-esophageal reflux disease without esophagitis: Secondary | ICD-10-CM | POA: Diagnosis not present

## 2016-09-27 DIAGNOSIS — R601 Generalized edema: Secondary | ICD-10-CM | POA: Diagnosis not present

## 2016-09-27 DIAGNOSIS — Z510 Encounter for antineoplastic radiation therapy: Secondary | ICD-10-CM | POA: Diagnosis not present

## 2016-09-27 DIAGNOSIS — I5032 Chronic diastolic (congestive) heart failure: Secondary | ICD-10-CM | POA: Diagnosis not present

## 2016-09-27 DIAGNOSIS — R600 Localized edema: Secondary | ICD-10-CM | POA: Diagnosis not present

## 2016-09-27 DIAGNOSIS — R569 Unspecified convulsions: Secondary | ICD-10-CM | POA: Diagnosis not present

## 2016-09-27 DIAGNOSIS — C661 Malignant neoplasm of right ureter: Secondary | ICD-10-CM | POA: Diagnosis not present

## 2016-09-28 DIAGNOSIS — R600 Localized edema: Secondary | ICD-10-CM | POA: Diagnosis not present

## 2016-09-28 DIAGNOSIS — R350 Frequency of micturition: Secondary | ICD-10-CM | POA: Diagnosis not present

## 2016-09-28 DIAGNOSIS — C661 Malignant neoplasm of right ureter: Secondary | ICD-10-CM | POA: Diagnosis not present

## 2016-09-28 DIAGNOSIS — R0602 Shortness of breath: Secondary | ICD-10-CM | POA: Diagnosis not present

## 2016-09-28 DIAGNOSIS — Z510 Encounter for antineoplastic radiation therapy: Secondary | ICD-10-CM | POA: Diagnosis not present

## 2016-09-28 DIAGNOSIS — R319 Hematuria, unspecified: Secondary | ICD-10-CM | POA: Diagnosis not present

## 2016-10-01 DIAGNOSIS — R350 Frequency of micturition: Secondary | ICD-10-CM | POA: Diagnosis not present

## 2016-10-01 DIAGNOSIS — Z510 Encounter for antineoplastic radiation therapy: Secondary | ICD-10-CM | POA: Diagnosis not present

## 2016-10-01 DIAGNOSIS — R600 Localized edema: Secondary | ICD-10-CM | POA: Diagnosis not present

## 2016-10-01 DIAGNOSIS — R319 Hematuria, unspecified: Secondary | ICD-10-CM | POA: Diagnosis not present

## 2016-10-01 DIAGNOSIS — R0602 Shortness of breath: Secondary | ICD-10-CM | POA: Diagnosis not present

## 2016-10-01 DIAGNOSIS — C661 Malignant neoplasm of right ureter: Secondary | ICD-10-CM | POA: Diagnosis not present

## 2016-10-02 DIAGNOSIS — Z510 Encounter for antineoplastic radiation therapy: Secondary | ICD-10-CM | POA: Diagnosis not present

## 2016-10-02 DIAGNOSIS — C661 Malignant neoplasm of right ureter: Secondary | ICD-10-CM | POA: Diagnosis not present

## 2016-10-02 DIAGNOSIS — R0602 Shortness of breath: Secondary | ICD-10-CM | POA: Diagnosis not present

## 2016-10-02 DIAGNOSIS — R319 Hematuria, unspecified: Secondary | ICD-10-CM | POA: Diagnosis not present

## 2016-10-02 DIAGNOSIS — R600 Localized edema: Secondary | ICD-10-CM | POA: Diagnosis not present

## 2016-10-02 DIAGNOSIS — R350 Frequency of micturition: Secondary | ICD-10-CM | POA: Diagnosis not present

## 2016-10-03 DIAGNOSIS — Z510 Encounter for antineoplastic radiation therapy: Secondary | ICD-10-CM | POA: Diagnosis not present

## 2016-10-03 DIAGNOSIS — R319 Hematuria, unspecified: Secondary | ICD-10-CM | POA: Diagnosis not present

## 2016-10-03 DIAGNOSIS — C661 Malignant neoplasm of right ureter: Secondary | ICD-10-CM | POA: Diagnosis not present

## 2016-10-03 DIAGNOSIS — R350 Frequency of micturition: Secondary | ICD-10-CM | POA: Diagnosis not present

## 2016-10-03 DIAGNOSIS — R600 Localized edema: Secondary | ICD-10-CM | POA: Diagnosis not present

## 2016-10-03 DIAGNOSIS — R0602 Shortness of breath: Secondary | ICD-10-CM | POA: Diagnosis not present

## 2016-10-04 ENCOUNTER — Other Ambulatory Visit
Admission: RE | Admit: 2016-10-04 | Discharge: 2016-10-04 | Disposition: A | Discharge status: Home / Self Care | Payer: Medicare Other | Source: Other Acute Inpatient Hospital | Attending: Cardiology | Admitting: Cardiology

## 2016-10-04 DIAGNOSIS — L97529 Non-pressure chronic ulcer of other part of left foot with unspecified severity: Secondary | ICD-10-CM | POA: Diagnosis not present

## 2016-10-04 DIAGNOSIS — R0602 Shortness of breath: Secondary | ICD-10-CM | POA: Diagnosis not present

## 2016-10-04 DIAGNOSIS — C661 Malignant neoplasm of right ureter: Secondary | ICD-10-CM | POA: Diagnosis not present

## 2016-10-04 DIAGNOSIS — R350 Frequency of micturition: Secondary | ICD-10-CM | POA: Diagnosis not present

## 2016-10-04 DIAGNOSIS — M109 Gout, unspecified: Secondary | ICD-10-CM | POA: Diagnosis not present

## 2016-10-04 DIAGNOSIS — I5032 Chronic diastolic (congestive) heart failure: Secondary | ICD-10-CM | POA: Diagnosis not present

## 2016-10-04 DIAGNOSIS — R600 Localized edema: Secondary | ICD-10-CM | POA: Diagnosis not present

## 2016-10-04 DIAGNOSIS — R601 Generalized edema: Secondary | ICD-10-CM | POA: Diagnosis not present

## 2016-10-04 DIAGNOSIS — R569 Unspecified convulsions: Secondary | ICD-10-CM | POA: Diagnosis not present

## 2016-10-04 DIAGNOSIS — L97519 Non-pressure chronic ulcer of other part of right foot with unspecified severity: Secondary | ICD-10-CM | POA: Diagnosis not present

## 2016-10-04 DIAGNOSIS — Z510 Encounter for antineoplastic radiation therapy: Secondary | ICD-10-CM | POA: Diagnosis not present

## 2016-10-04 DIAGNOSIS — I509 Heart failure, unspecified: Secondary | ICD-10-CM | POA: Insufficient documentation

## 2016-10-04 DIAGNOSIS — Z952 Presence of prosthetic heart valve: Secondary | ICD-10-CM | POA: Diagnosis not present

## 2016-10-04 DIAGNOSIS — R319 Hematuria, unspecified: Secondary | ICD-10-CM | POA: Diagnosis not present

## 2016-10-04 LAB — BRAIN NATRIURETIC PEPTIDE: B NATRIURETIC PEPTIDE 5: 503 pg/mL — AB (ref 0.0–100.0)

## 2016-10-05 DIAGNOSIS — R350 Frequency of micturition: Secondary | ICD-10-CM | POA: Diagnosis not present

## 2016-10-05 DIAGNOSIS — R319 Hematuria, unspecified: Secondary | ICD-10-CM | POA: Diagnosis not present

## 2016-10-05 DIAGNOSIS — Z510 Encounter for antineoplastic radiation therapy: Secondary | ICD-10-CM | POA: Diagnosis not present

## 2016-10-05 DIAGNOSIS — R0602 Shortness of breath: Secondary | ICD-10-CM | POA: Diagnosis not present

## 2016-10-05 DIAGNOSIS — R600 Localized edema: Secondary | ICD-10-CM | POA: Diagnosis not present

## 2016-10-05 DIAGNOSIS — C661 Malignant neoplasm of right ureter: Secondary | ICD-10-CM | POA: Diagnosis not present

## 2016-10-08 DIAGNOSIS — R600 Localized edema: Secondary | ICD-10-CM | POA: Diagnosis not present

## 2016-10-08 DIAGNOSIS — Z510 Encounter for antineoplastic radiation therapy: Secondary | ICD-10-CM | POA: Diagnosis not present

## 2016-10-08 DIAGNOSIS — R319 Hematuria, unspecified: Secondary | ICD-10-CM | POA: Diagnosis not present

## 2016-10-08 DIAGNOSIS — R0602 Shortness of breath: Secondary | ICD-10-CM | POA: Diagnosis not present

## 2016-10-08 DIAGNOSIS — R350 Frequency of micturition: Secondary | ICD-10-CM | POA: Diagnosis not present

## 2016-10-08 DIAGNOSIS — C661 Malignant neoplasm of right ureter: Secondary | ICD-10-CM | POA: Diagnosis not present

## 2016-10-09 DIAGNOSIS — R0602 Shortness of breath: Secondary | ICD-10-CM | POA: Diagnosis not present

## 2016-10-09 DIAGNOSIS — R319 Hematuria, unspecified: Secondary | ICD-10-CM | POA: Diagnosis not present

## 2016-10-09 DIAGNOSIS — Z510 Encounter for antineoplastic radiation therapy: Secondary | ICD-10-CM | POA: Diagnosis not present

## 2016-10-09 DIAGNOSIS — R350 Frequency of micturition: Secondary | ICD-10-CM | POA: Diagnosis not present

## 2016-10-09 DIAGNOSIS — C661 Malignant neoplasm of right ureter: Secondary | ICD-10-CM | POA: Diagnosis not present

## 2016-10-09 DIAGNOSIS — R600 Localized edema: Secondary | ICD-10-CM | POA: Diagnosis not present

## 2016-10-10 DIAGNOSIS — R600 Localized edema: Secondary | ICD-10-CM | POA: Diagnosis not present

## 2016-10-10 DIAGNOSIS — R350 Frequency of micturition: Secondary | ICD-10-CM | POA: Diagnosis not present

## 2016-10-10 DIAGNOSIS — C661 Malignant neoplasm of right ureter: Secondary | ICD-10-CM | POA: Diagnosis not present

## 2016-10-10 DIAGNOSIS — Z510 Encounter for antineoplastic radiation therapy: Secondary | ICD-10-CM | POA: Diagnosis not present

## 2016-10-10 DIAGNOSIS — R0602 Shortness of breath: Secondary | ICD-10-CM | POA: Diagnosis not present

## 2016-10-10 DIAGNOSIS — R319 Hematuria, unspecified: Secondary | ICD-10-CM | POA: Diagnosis not present

## 2016-10-11 DIAGNOSIS — R0602 Shortness of breath: Secondary | ICD-10-CM | POA: Diagnosis not present

## 2016-10-11 DIAGNOSIS — R600 Localized edema: Secondary | ICD-10-CM | POA: Diagnosis not present

## 2016-10-11 DIAGNOSIS — Z510 Encounter for antineoplastic radiation therapy: Secondary | ICD-10-CM | POA: Diagnosis not present

## 2016-10-11 DIAGNOSIS — C661 Malignant neoplasm of right ureter: Secondary | ICD-10-CM | POA: Diagnosis not present

## 2016-10-11 DIAGNOSIS — R319 Hematuria, unspecified: Secondary | ICD-10-CM | POA: Diagnosis not present

## 2016-10-11 DIAGNOSIS — R350 Frequency of micturition: Secondary | ICD-10-CM | POA: Diagnosis not present

## 2016-10-12 DIAGNOSIS — R0602 Shortness of breath: Secondary | ICD-10-CM | POA: Diagnosis not present

## 2016-10-12 DIAGNOSIS — Z510 Encounter for antineoplastic radiation therapy: Secondary | ICD-10-CM | POA: Diagnosis not present

## 2016-10-12 DIAGNOSIS — R319 Hematuria, unspecified: Secondary | ICD-10-CM | POA: Diagnosis not present

## 2016-10-12 DIAGNOSIS — C661 Malignant neoplasm of right ureter: Secondary | ICD-10-CM | POA: Diagnosis not present

## 2016-10-12 DIAGNOSIS — R600 Localized edema: Secondary | ICD-10-CM | POA: Diagnosis not present

## 2016-10-12 DIAGNOSIS — R350 Frequency of micturition: Secondary | ICD-10-CM | POA: Diagnosis not present

## 2016-10-14 DIAGNOSIS — R601 Generalized edema: Secondary | ICD-10-CM | POA: Diagnosis not present

## 2016-10-14 DIAGNOSIS — R569 Unspecified convulsions: Secondary | ICD-10-CM | POA: Diagnosis not present

## 2016-10-14 DIAGNOSIS — M6281 Muscle weakness (generalized): Secondary | ICD-10-CM | POA: Diagnosis not present

## 2016-10-14 DIAGNOSIS — K219 Gastro-esophageal reflux disease without esophagitis: Secondary | ICD-10-CM | POA: Diagnosis not present

## 2016-10-14 DIAGNOSIS — Z952 Presence of prosthetic heart valve: Secondary | ICD-10-CM | POA: Diagnosis not present

## 2016-10-14 DIAGNOSIS — C661 Malignant neoplasm of right ureter: Secondary | ICD-10-CM | POA: Diagnosis not present

## 2016-10-14 DIAGNOSIS — M109 Gout, unspecified: Secondary | ICD-10-CM | POA: Diagnosis not present

## 2016-10-14 DIAGNOSIS — I5032 Chronic diastolic (congestive) heart failure: Secondary | ICD-10-CM | POA: Diagnosis not present

## 2016-10-15 DIAGNOSIS — R319 Hematuria, unspecified: Secondary | ICD-10-CM | POA: Diagnosis not present

## 2016-10-15 DIAGNOSIS — Z510 Encounter for antineoplastic radiation therapy: Secondary | ICD-10-CM | POA: Diagnosis not present

## 2016-10-15 DIAGNOSIS — R600 Localized edema: Secondary | ICD-10-CM | POA: Diagnosis not present

## 2016-10-15 DIAGNOSIS — C661 Malignant neoplasm of right ureter: Secondary | ICD-10-CM | POA: Diagnosis not present

## 2016-10-15 DIAGNOSIS — R350 Frequency of micturition: Secondary | ICD-10-CM | POA: Diagnosis not present

## 2016-10-15 DIAGNOSIS — R0602 Shortness of breath: Secondary | ICD-10-CM | POA: Diagnosis not present

## 2016-10-17 DIAGNOSIS — H25813 Combined forms of age-related cataract, bilateral: Secondary | ICD-10-CM | POA: Diagnosis not present

## 2016-10-17 DIAGNOSIS — H353111 Nonexudative age-related macular degeneration, right eye, early dry stage: Secondary | ICD-10-CM | POA: Diagnosis not present

## 2016-10-17 DIAGNOSIS — H524 Presbyopia: Secondary | ICD-10-CM | POA: Diagnosis not present

## 2016-10-22 DIAGNOSIS — Z952 Presence of prosthetic heart valve: Secondary | ICD-10-CM | POA: Diagnosis not present

## 2016-10-22 DIAGNOSIS — I051 Rheumatic mitral insufficiency: Secondary | ICD-10-CM | POA: Diagnosis not present

## 2016-10-25 DIAGNOSIS — Z952 Presence of prosthetic heart valve: Secondary | ICD-10-CM | POA: Diagnosis not present

## 2016-10-25 DIAGNOSIS — Z950 Presence of cardiac pacemaker: Secondary | ICD-10-CM | POA: Diagnosis not present

## 2016-10-25 DIAGNOSIS — I10 Essential (primary) hypertension: Secondary | ICD-10-CM | POA: Diagnosis not present

## 2016-10-26 DIAGNOSIS — C661 Malignant neoplasm of right ureter: Secondary | ICD-10-CM | POA: Diagnosis not present

## 2016-10-26 DIAGNOSIS — M109 Gout, unspecified: Secondary | ICD-10-CM | POA: Diagnosis not present

## 2016-10-26 DIAGNOSIS — R601 Generalized edema: Secondary | ICD-10-CM | POA: Diagnosis not present

## 2016-10-26 DIAGNOSIS — R569 Unspecified convulsions: Secondary | ICD-10-CM | POA: Diagnosis not present

## 2016-10-26 DIAGNOSIS — I10 Essential (primary) hypertension: Secondary | ICD-10-CM | POA: Diagnosis not present

## 2016-10-26 DIAGNOSIS — K219 Gastro-esophageal reflux disease without esophagitis: Secondary | ICD-10-CM | POA: Diagnosis not present

## 2016-10-26 DIAGNOSIS — Z952 Presence of prosthetic heart valve: Secondary | ICD-10-CM | POA: Diagnosis not present

## 2016-10-26 DIAGNOSIS — I5032 Chronic diastolic (congestive) heart failure: Secondary | ICD-10-CM | POA: Diagnosis not present

## 2016-10-31 DIAGNOSIS — I361 Nonrheumatic tricuspid (valve) insufficiency: Secondary | ICD-10-CM | POA: Diagnosis not present

## 2016-10-31 DIAGNOSIS — I517 Cardiomegaly: Secondary | ICD-10-CM | POA: Diagnosis not present

## 2016-10-31 DIAGNOSIS — I340 Nonrheumatic mitral (valve) insufficiency: Secondary | ICD-10-CM | POA: Diagnosis not present

## 2016-10-31 DIAGNOSIS — Z952 Presence of prosthetic heart valve: Secondary | ICD-10-CM | POA: Diagnosis not present

## 2016-10-31 DIAGNOSIS — I081 Rheumatic disorders of both mitral and tricuspid valves: Secondary | ICD-10-CM | POA: Diagnosis not present

## 2016-10-31 DIAGNOSIS — R931 Abnormal findings on diagnostic imaging of heart and coronary circulation: Secondary | ICD-10-CM | POA: Diagnosis not present

## 2016-11-01 ENCOUNTER — Telehealth: Payer: Self-pay | Admitting: Family Medicine

## 2016-11-01 DIAGNOSIS — I110 Hypertensive heart disease with heart failure: Secondary | ICD-10-CM | POA: Diagnosis not present

## 2016-11-01 DIAGNOSIS — I080 Rheumatic disorders of both mitral and aortic valves: Secondary | ICD-10-CM | POA: Diagnosis not present

## 2016-11-01 NOTE — Telephone Encounter (Signed)
Ailene Ravel with [facility] called to give Dr Jeananne Rama regarding patients PT  1 week 2 xs a week 2 week 2 xs a week  We should be receiving the orders for Dr Jeananne Rama to sign

## 2016-11-05 DIAGNOSIS — I080 Rheumatic disorders of both mitral and aortic valves: Secondary | ICD-10-CM | POA: Diagnosis not present

## 2016-11-05 DIAGNOSIS — Z48812 Encounter for surgical aftercare following surgery on the circulatory system: Secondary | ICD-10-CM | POA: Diagnosis not present

## 2016-11-06 ENCOUNTER — Encounter: Payer: Self-pay | Admitting: Family Medicine

## 2016-11-06 ENCOUNTER — Ambulatory Visit (INDEPENDENT_AMBULATORY_CARE_PROVIDER_SITE_OTHER): Payer: Medicare Other | Admitting: Family Medicine

## 2016-11-06 ENCOUNTER — Other Ambulatory Visit: Payer: Self-pay | Admitting: Family Medicine

## 2016-11-06 VITALS — BP 132/74 | HR 109 | Ht 71.0 in | Wt 131.0 lb

## 2016-11-06 DIAGNOSIS — Z952 Presence of prosthetic heart valve: Secondary | ICD-10-CM | POA: Diagnosis not present

## 2016-11-06 DIAGNOSIS — R1013 Epigastric pain: Secondary | ICD-10-CM

## 2016-11-06 DIAGNOSIS — E441 Mild protein-calorie malnutrition: Secondary | ICD-10-CM | POA: Insufficient documentation

## 2016-11-06 DIAGNOSIS — I080 Rheumatic disorders of both mitral and aortic valves: Secondary | ICD-10-CM | POA: Diagnosis not present

## 2016-11-06 DIAGNOSIS — I10 Essential (primary) hypertension: Secondary | ICD-10-CM | POA: Diagnosis not present

## 2016-11-06 DIAGNOSIS — R2990 Unspecified symptoms and signs involving the nervous system: Secondary | ICD-10-CM

## 2016-11-06 DIAGNOSIS — J8410 Pulmonary fibrosis, unspecified: Secondary | ICD-10-CM | POA: Diagnosis not present

## 2016-11-06 DIAGNOSIS — R109 Unspecified abdominal pain: Secondary | ICD-10-CM | POA: Insufficient documentation

## 2016-11-06 DIAGNOSIS — E785 Hyperlipidemia, unspecified: Secondary | ICD-10-CM | POA: Diagnosis not present

## 2016-11-06 DIAGNOSIS — Z48812 Encounter for surgical aftercare following surgery on the circulatory system: Secondary | ICD-10-CM | POA: Diagnosis not present

## 2016-11-06 MED ORDER — CLOPIDOGREL BISULFATE 75 MG PO TABS: 75.0000 mg | ORAL_TABLET | Freq: Every day | ORAL | 3 refills | Status: DC

## 2016-11-06 MED ORDER — AMOXICILLIN 875 MG PO TABS: 875.0000 mg | ORAL_TABLET | Freq: Two times a day (BID) | ORAL | 0 refills | Status: DC

## 2016-11-06 NOTE — Assessment & Plan Note (Signed)
The current medical regimen is effective;  continue present plan and medications.  

## 2016-11-06 NOTE — Assessment & Plan Note (Signed)
Will check for ulcer disease.

## 2016-11-06 NOTE — Assessment & Plan Note (Signed)
Discussed diet liberalizing diet not worrying about healthy foods but high calorie foods

## 2016-11-06 NOTE — Assessment & Plan Note (Signed)
Will hold Lipitor for a month or so and observe symptoms if seems to help make discontinue otherwise will consider restarting.

## 2016-11-06 NOTE — Assessment & Plan Note (Signed)
Discussed exercise diet nutrition

## 2016-11-06 NOTE — Progress Notes (Signed)
BP 132/74 (BP Location: Left Arm)   Pulse (!) 109   Ht '5\' 11"'$  (1.803 m)   Wt 131 lb (59.4 kg)   SpO2 99%   BMI 18.27 kg/m    Subjective:    Patient ID: Andrew Ali, male    DOB: 17-Mar-1933, 83 y.o.   MRN: [id number]  HPI: Andrew Ali is a 83 y.o. male  Chief Complaint  Patient presents with  . Follow-up  . Hypertension  . Hyperlipidemia   Patient follow-up multiple [facility] visits and care patient most recently had valve replaced had radiation multiple procedures and office visits. Has been in nursing home for rehabilitation also suffers from pulmonary fibrosis complicating his recovery. Patient with multiple complaints some epigastric pain discomfort Protonix didn't seem to help much takes extra Tums that helps his had loss of appetite but lives by himself with great deal of stress right still in nursing home. He is unable to provide for her care due to his generalized weakness. Patient also with mild leg edema discussed support hose Needs Amoxil for dental prophylaxis Review of lab work done prior to discharge nursing home earlier this month showing mild anemia discussed after surgeries will need vitamins with iron which patient will get.  Relevant past medical, surgical, family and social history reviewed and updated as indicated. Interim medical history since our last visit reviewed. Allergies and medications reviewed and updated.  Review of Systems  Constitutional: Positive for appetite change and fatigue. Negative for fever.  Respiratory: Positive for cough and shortness of breath. Negative for chest tightness and wheezing.   Cardiovascular: Positive for leg swelling. Negative for chest pain and palpitations.    Per HPI unless specifically indicated above     Objective:    BP 132/74 (BP Location: Left Arm)   Pulse (!) 109   Ht '5\' 11"'$  (1.803 m)   Wt 131 lb (59.4 kg)   SpO2 99%   BMI 18.27 kg/m   Wt Readings from Last 3 Encounters:  11/06/16 131 lb (59.4 kg)   08/30/16 143 lb (64.9 kg)  02/28/16 147 lb (66.7 kg)    Physical Exam  Constitutional: He is oriented to person, place, and time. He appears well-developed and well-nourished.  HENT:  Head: Normocephalic and atraumatic.  Eyes: Conjunctivae and EOM are normal.  Neck: Normal range of motion.  Cardiovascular: Normal rate and regular rhythm.   Aortic valve  Pulmonary/Chest: Effort normal and breath sounds normal.  Musculoskeletal: Normal range of motion.  Neurological: He is alert and oriented to person, place, and time.  Skin: No erythema.  Psychiatric: He has a normal mood and affect. His behavior is normal. Judgment and thought content normal.    Results for orders placed or performed during the hospital encounter of 10/04/16  Brain natriuretic peptide  Result Value Ref Range   B Natriuretic Peptide 503.0 (H) 0.0 - 100.0 pg/mL      Assessment & Plan:   Problem List Items Addressed This Visit      Cardiovascular and Mediastinum   Hypertension - Primary    The current medical regimen is effective;  continue present plan and medications.       Relevant Orders   Basic metabolic panel     Respiratory   Pulmonary fibrosis ([place])    Discussed exercise diet nutrition        Other   Hyperlipidemia    Will hold Lipitor for a month or so and observe symptoms if seems to  help make discontinue otherwise will consider restarting.      Relevant Orders   Basic metabolic panel   Episode of transient neurologic symptoms    The current medical regimen is effective;  continue present plan and medications.       S/P TAVR (transcatheter aortic valve replacement)    Slowly improving again complicated by pulmonary fibrosis is reconditioning discuss exercise and nutrition      Relevant Medications   clopidogrel (PLAVIX) 75 MG tablet   Abdominal pain    Will check for ulcer disease.      Relevant Orders   Helicobacter pylori abs-IgG+IgA, bld   Mild protein-calorie  malnutrition ([place])    Discussed diet liberalizing diet not worrying about healthy foods but high calorie foods          Follow up plan: Return in about 4 weeks (around 12/04/2016) for CBC.

## 2016-11-06 NOTE — Assessment & Plan Note (Signed)
Slowly improving again complicated by pulmonary fibrosis is reconditioning discuss exercise and nutrition

## 2016-11-08 DIAGNOSIS — I080 Rheumatic disorders of both mitral and aortic valves: Secondary | ICD-10-CM | POA: Diagnosis not present

## 2016-11-08 DIAGNOSIS — Z48812 Encounter for surgical aftercare following surgery on the circulatory system: Secondary | ICD-10-CM | POA: Diagnosis not present

## 2016-11-08 LAB — BASIC METABOLIC PANEL WITH GFR
BUN/Creatinine Ratio: 12 (ref 10–24)
BUN: 17 mg/dL (ref 8–27)
CO2: 21 mmol/L (ref 18–29)
Calcium: 10.7 mg/dL — ABNORMAL HIGH (ref 8.6–10.2)
Chloride: 108 mmol/L — ABNORMAL HIGH (ref 96–106)
Creatinine, Ser: 1.42 mg/dL — ABNORMAL HIGH (ref 0.76–1.27)
GFR calc Af Amer: 53 mL/min/1.73 — ABNORMAL LOW (ref >59)
GFR calc non Af Amer: 46 mL/min/1.73 — ABNORMAL LOW (ref >59)
Glucose: 113 mg/dL — ABNORMAL HIGH (ref 65–99)
Potassium: 4.3 mmol/L (ref 3.5–5.2)
Sodium: 146 mmol/L — ABNORMAL HIGH (ref 134–144)

## 2016-11-08 LAB — PLEASE NOTE

## 2016-11-08 LAB — HELICOBACTER PYLORI ABS-IGG+IGA, BLD
H. pylori, IgA Abs: <9 U (ref 0.0–8.9)
H. pylori, IgG AbS: 1.71 {index_val} — ABNORMAL HIGH (ref 0.00–0.79)

## 2016-11-12 ENCOUNTER — Telehealth: Payer: Self-pay | Admitting: Family Medicine

## 2016-11-12 DIAGNOSIS — K279 Peptic ulcer, site unspecified, unspecified as acute or chronic, without hemorrhage or perforation: Secondary | ICD-10-CM

## 2016-11-12 DIAGNOSIS — I080 Rheumatic disorders of both mitral and aortic valves: Secondary | ICD-10-CM | POA: Diagnosis not present

## 2016-11-12 DIAGNOSIS — Z48812 Encounter for surgical aftercare following surgery on the circulatory system: Secondary | ICD-10-CM | POA: Diagnosis not present

## 2016-11-12 MED ORDER — AMOXICILLIN 500 MG PO TABS: 1000.0000 mg | ORAL_TABLET | Freq: Two times a day (BID) | ORAL | 0 refills | Status: DC

## 2016-11-12 MED ORDER — METRONIDAZOLE 500 MG PO TABS: 500.0000 mg | ORAL_TABLET | Freq: Two times a day (BID) | ORAL | 0 refills | Status: DC

## 2016-11-12 MED ORDER — CLARITHROMYCIN 500 MG PO TABS: 500.0000 mg | ORAL_TABLET | Freq: Two times a day (BID) | ORAL | 0 refills | Status: DC

## 2016-11-12 NOTE — Telephone Encounter (Signed)
Phone call Discussed with patient H. pylori test positive will start treatment patient education given.  Also discuss may use some when necessary Lasix for occasional leg edema.

## 2016-11-14 ENCOUNTER — Telehealth: Payer: Self-pay | Admitting: Family Medicine

## 2016-11-14 DIAGNOSIS — Z8673 Personal history of transient ischemic attack (TIA), and cerebral infarction without residual deficits: Secondary | ICD-10-CM | POA: Diagnosis not present

## 2016-11-14 DIAGNOSIS — Z96643 Presence of artificial hip joint, bilateral: Secondary | ICD-10-CM | POA: Diagnosis not present

## 2016-11-14 DIAGNOSIS — C669 Malignant neoplasm of unspecified ureter: Secondary | ICD-10-CM | POA: Diagnosis not present

## 2016-11-14 DIAGNOSIS — C61 Malignant neoplasm of prostate: Secondary | ICD-10-CM | POA: Diagnosis not present

## 2016-11-14 DIAGNOSIS — Z923 Personal history of irradiation: Secondary | ICD-10-CM | POA: Diagnosis not present

## 2016-11-14 DIAGNOSIS — C661 Malignant neoplasm of right ureter: Secondary | ICD-10-CM | POA: Diagnosis not present

## 2016-11-14 DIAGNOSIS — Z8249 Family history of ischemic heart disease and other diseases of the circulatory system: Secondary | ICD-10-CM | POA: Diagnosis not present

## 2016-11-14 DIAGNOSIS — I509 Heart failure, unspecified: Secondary | ICD-10-CM | POA: Diagnosis not present

## 2016-11-14 DIAGNOSIS — I110 Hypertensive heart disease with heart failure: Secondary | ICD-10-CM | POA: Diagnosis not present

## 2016-11-14 DIAGNOSIS — R918 Other nonspecific abnormal finding of lung field: Secondary | ICD-10-CM | POA: Diagnosis not present

## 2016-11-14 DIAGNOSIS — C7A8 Other malignant neuroendocrine tumors: Secondary | ICD-10-CM | POA: Diagnosis not present

## 2016-11-14 DIAGNOSIS — Z950 Presence of cardiac pacemaker: Secondary | ICD-10-CM | POA: Diagnosis not present

## 2016-11-14 DIAGNOSIS — Z87442 Personal history of urinary calculi: Secondary | ICD-10-CM | POA: Diagnosis not present

## 2016-11-14 DIAGNOSIS — N135 Crossing vessel and stricture of ureter without hydronephrosis: Secondary | ICD-10-CM | POA: Diagnosis not present

## 2016-11-14 DIAGNOSIS — Z79899 Other long term (current) drug therapy: Secondary | ICD-10-CM | POA: Diagnosis not present

## 2016-11-14 DIAGNOSIS — R0602 Shortness of breath: Secondary | ICD-10-CM | POA: Diagnosis not present

## 2016-11-14 DIAGNOSIS — J90 Pleural effusion, not elsewhere classified: Secondary | ICD-10-CM | POA: Diagnosis not present

## 2016-11-14 DIAGNOSIS — N1330 Unspecified hydronephrosis: Secondary | ICD-10-CM | POA: Diagnosis not present

## 2016-11-14 DIAGNOSIS — N200 Calculus of kidney: Secondary | ICD-10-CM | POA: Diagnosis not present

## 2016-11-14 DIAGNOSIS — K30 Functional dyspepsia: Secondary | ICD-10-CM | POA: Diagnosis not present

## 2016-11-14 DIAGNOSIS — Z952 Presence of prosthetic heart valve: Secondary | ICD-10-CM | POA: Diagnosis not present

## 2016-11-14 NOTE — Telephone Encounter (Signed)
Number left was a sweepstakes line. Tried x 2.

## 2016-11-14 NOTE — Telephone Encounter (Signed)
Mark with [facility] called regarding the Nexium '40mg'$  .  He has some information he would like to discuss with Dr Jeananne Rama or his assistant.  Thanks  [phone]  Ref# [id number]

## 2016-11-16 DIAGNOSIS — I080 Rheumatic disorders of both mitral and aortic valves: Secondary | ICD-10-CM | POA: Diagnosis not present

## 2016-11-16 DIAGNOSIS — Z48812 Encounter for surgical aftercare following surgery on the circulatory system: Secondary | ICD-10-CM | POA: Diagnosis not present

## 2016-11-20 DIAGNOSIS — I080 Rheumatic disorders of both mitral and aortic valves: Secondary | ICD-10-CM | POA: Diagnosis not present

## 2016-11-20 DIAGNOSIS — Z48812 Encounter for surgical aftercare following surgery on the circulatory system: Secondary | ICD-10-CM | POA: Diagnosis not present

## 2016-11-22 DIAGNOSIS — I080 Rheumatic disorders of both mitral and aortic valves: Secondary | ICD-10-CM | POA: Diagnosis not present

## 2016-11-22 DIAGNOSIS — Z48812 Encounter for surgical aftercare following surgery on the circulatory system: Secondary | ICD-10-CM | POA: Diagnosis not present

## 2016-11-30 DIAGNOSIS — N135 Crossing vessel and stricture of ureter without hydronephrosis: Secondary | ICD-10-CM | POA: Diagnosis not present

## 2016-11-30 DIAGNOSIS — Z952 Presence of prosthetic heart valve: Secondary | ICD-10-CM | POA: Diagnosis not present

## 2016-11-30 DIAGNOSIS — Z8546 Personal history of malignant neoplasm of prostate: Secondary | ICD-10-CM | POA: Diagnosis not present

## 2016-11-30 DIAGNOSIS — K219 Gastro-esophageal reflux disease without esophagitis: Secondary | ICD-10-CM | POA: Diagnosis not present

## 2016-11-30 DIAGNOSIS — E782 Mixed hyperlipidemia: Secondary | ICD-10-CM | POA: Diagnosis not present

## 2016-11-30 DIAGNOSIS — Z8673 Personal history of transient ischemic attack (TIA), and cerebral infarction without residual deficits: Secondary | ICD-10-CM | POA: Diagnosis not present

## 2016-11-30 DIAGNOSIS — C661 Malignant neoplasm of right ureter: Secondary | ICD-10-CM | POA: Diagnosis not present

## 2016-11-30 DIAGNOSIS — R768 Other specified abnormal immunological findings in serum: Secondary | ICD-10-CM | POA: Diagnosis not present

## 2016-12-04 DIAGNOSIS — H903 Sensorineural hearing loss, bilateral: Secondary | ICD-10-CM | POA: Diagnosis not present

## 2016-12-04 DIAGNOSIS — H6123 Impacted cerumen, bilateral: Secondary | ICD-10-CM | POA: Diagnosis not present

## 2016-12-19 ENCOUNTER — Encounter: Payer: Self-pay | Admitting: Family Medicine

## 2016-12-19 ENCOUNTER — Ambulatory Visit (INDEPENDENT_AMBULATORY_CARE_PROVIDER_SITE_OTHER): Payer: Medicare Other | Admitting: Family Medicine

## 2016-12-19 VITALS — BP 121/82 | HR 102 | Wt 136.0 lb

## 2016-12-19 DIAGNOSIS — J8410 Pulmonary fibrosis, unspecified: Secondary | ICD-10-CM | POA: Diagnosis not present

## 2016-12-19 DIAGNOSIS — I10 Essential (primary) hypertension: Secondary | ICD-10-CM | POA: Diagnosis not present

## 2016-12-19 DIAGNOSIS — E785 Hyperlipidemia, unspecified: Secondary | ICD-10-CM | POA: Diagnosis not present

## 2016-12-19 NOTE — Assessment & Plan Note (Signed)
Recommended an appointment with pulmonary. Patient wants to go to a doctor at [facility] will make accommodations through his [facility] Drs.

## 2016-12-19 NOTE — Progress Notes (Signed)
BP 121/82   Pulse (!) 102   Wt 136 lb (61.7 kg)   SpO2 98%   BMI 18.97 kg/m    Subjective:    Patient ID: Andrew Ali, male    DOB: May 14, 1933, 83 y.o.   MRN: [id number]  HPI: Andrew Ali is a 83 y.o. male  Chief Complaint  Patient presents with  . Follow-up  . Hypertension  Recheck medication patient doing okay having a great deal of stress with multiple medical conditions. Working with multiple [facility] Drs. for multiple conditions. All in all doing stable. Patient having a great deal of problems with his wife has full-time caregiver in the house now that just 24 hours. This is helping especially with food preparation and cleaning. Primary concern is shortness of breath that comes on with just sitting no chest pain or pulmonary fibrosis type symptoms. Patient's oxygen levels are stable. Hasn't been to pulmonary specialist in some time.  Relevant past medical, surgical, family and social history reviewed and updated as indicated. Interim medical history since our last visit reviewed. Allergies and medications reviewed and updated.  Review of Systems  Constitutional: Positive for appetite change and fatigue. Negative for chills, diaphoresis and fever.  Respiratory: Negative.   Cardiovascular: Negative.     Per HPI unless specifically indicated above     Objective:    BP 121/82   Pulse (!) 102   Wt 136 lb (61.7 kg)   SpO2 98%   BMI 18.97 kg/m   Wt Readings from Last 3 Encounters:  12/19/16 136 lb (61.7 kg)  11/06/16 131 lb (59.4 kg)  08/30/16 143 lb (64.9 kg)    Physical Exam  Constitutional: He is oriented to person, place, and time. He appears well-developed and well-nourished.  HENT:  Head: Normocephalic and atraumatic.  Eyes: Conjunctivae and EOM are normal.  Neck: Normal range of motion.  Cardiovascular: Normal rate, regular rhythm and normal heart sounds.   Pulmonary/Chest: Effort normal and breath sounds normal.  Musculoskeletal: Normal range of  motion.  Neurological: He is alert and oriented to person, place, and time.  Skin: No erythema.  Psychiatric: He has a normal mood and affect. His behavior is normal. Judgment and thought content normal.    Results for orders placed or performed in visit on 73/53/29  Helicobacter pylori abs-IgG+IgA, bld  Result Value Ref Range   H. pylori, IgA Abs <9.0 0.0 - 8.9 units   H. pylori, IgG AbS 1.71 (H) 0.00 - 0.79 Index Value  Basic metabolic panel  Result Value Ref Range   Glucose 113 (H) 65 - 99 mg/dL   BUN 17 8 - 27 mg/dL   Creatinine, Ser 1.42 (H) 0.76 - 1.27 mg/dL   GFR calc non Af Amer 46 (L) >59 mL/min/1.73   GFR calc Af Amer 53 (L) >59 mL/min/1.73   BUN/Creatinine Ratio 12 10 - 24   Sodium 146 (H) 134 - 144 mmol/L   Potassium 4.3 3.5 - 5.2 mmol/L   Chloride 108 (H) 96 - 106 mmol/L   CO2 21 18 - 29 mmol/L   Calcium 10.7 (H) 8.6 - 10.2 mg/dL  Please Note  Result Value Ref Range   Please note Comment       Assessment & Plan:   Problem List Items Addressed This Visit      Cardiovascular and Mediastinum   Hypertension - Primary    The current medical regimen is effective;  continue present plan and medications.  Relevant Orders   Basic metabolic panel     Respiratory   Pulmonary fibrosis ([place])    Recommended an appointment with pulmonary. Patient wants to go to a doctor at [facility] will make accommodations through his [facility] Drs.      Relevant Orders   CBC with Differential/Platelet     Other   Hyperlipidemia       Follow up plan: Return in about 3 months (around 03/21/2017) for BMP,  Lipids, ALT, AST.

## 2016-12-19 NOTE — Assessment & Plan Note (Signed)
The current medical regimen is effective;  continue present plan and medications.  

## 2016-12-20 ENCOUNTER — Encounter: Payer: Self-pay | Admitting: Family Medicine

## 2016-12-20 LAB — CBC WITH DIFFERENTIAL/PLATELET

## 2016-12-20 LAB — BASIC METABOLIC PANEL
BUN/Creatinine Ratio: 17 (ref 10–24)
BUN: 21 mg/dL (ref 8–27)
CO2: 25 mmol/L (ref 18–29)
Calcium: 10.8 mg/dL — ABNORMAL HIGH (ref 8.6–10.2)
Chloride: 105 mmol/L (ref 96–106)
Creatinine, Ser: 1.24 mg/dL (ref 0.76–1.27)
GFR calc Af Amer: 63 mL/min/{1.73_m2} (ref >59)
GFR calc non Af Amer: 54 mL/min/{1.73_m2} — ABNORMAL LOW (ref >59)
Glucose: 104 mg/dL — ABNORMAL HIGH (ref 65–99)
Potassium: 4.1 mmol/L (ref 3.5–5.2)
Sodium: 144 mmol/L (ref 134–144)

## 2016-12-26 DIAGNOSIS — Z45018 Encounter for adjustment and management of other part of cardiac pacemaker: Secondary | ICD-10-CM | POA: Diagnosis not present

## 2016-12-26 DIAGNOSIS — C661 Malignant neoplasm of right ureter: Secondary | ICD-10-CM | POA: Diagnosis not present

## 2016-12-31 DIAGNOSIS — R064 Hyperventilation: Secondary | ICD-10-CM | POA: Diagnosis not present

## 2016-12-31 DIAGNOSIS — R079 Chest pain, unspecified: Secondary | ICD-10-CM | POA: Diagnosis not present

## 2016-12-31 DIAGNOSIS — N390 Urinary tract infection, site not specified: Secondary | ICD-10-CM | POA: Diagnosis not present

## 2016-12-31 DIAGNOSIS — I509 Heart failure, unspecified: Secondary | ICD-10-CM | POA: Diagnosis not present

## 2016-12-31 DIAGNOSIS — F419 Anxiety disorder, unspecified: Secondary | ICD-10-CM | POA: Diagnosis not present

## 2016-12-31 DIAGNOSIS — I110 Hypertensive heart disease with heart failure: Secondary | ICD-10-CM | POA: Diagnosis not present

## 2016-12-31 DIAGNOSIS — R002 Palpitations: Secondary | ICD-10-CM | POA: Diagnosis not present

## 2016-12-31 DIAGNOSIS — G479 Sleep disorder, unspecified: Secondary | ICD-10-CM | POA: Diagnosis not present

## 2016-12-31 DIAGNOSIS — J811 Chronic pulmonary edema: Secondary | ICD-10-CM | POA: Diagnosis not present

## 2016-12-31 DIAGNOSIS — R531 Weakness: Secondary | ICD-10-CM | POA: Diagnosis not present

## 2016-12-31 DIAGNOSIS — R5383 Other fatigue: Secondary | ICD-10-CM | POA: Diagnosis not present

## 2017-01-01 DIAGNOSIS — N390 Urinary tract infection, site not specified: Secondary | ICD-10-CM | POA: Diagnosis not present

## 2017-01-01 DIAGNOSIS — F419 Anxiety disorder, unspecified: Secondary | ICD-10-CM | POA: Diagnosis not present

## 2017-01-01 DIAGNOSIS — R002 Palpitations: Secondary | ICD-10-CM | POA: Diagnosis not present

## 2017-01-01 DIAGNOSIS — G479 Sleep disorder, unspecified: Secondary | ICD-10-CM | POA: Diagnosis not present

## 2017-01-14 DIAGNOSIS — D485 Neoplasm of uncertain behavior of skin: Secondary | ICD-10-CM | POA: Diagnosis not present

## 2017-01-14 DIAGNOSIS — Z85828 Personal history of other malignant neoplasm of skin: Secondary | ICD-10-CM | POA: Diagnosis not present

## 2017-01-14 DIAGNOSIS — C4442 Squamous cell carcinoma of skin of scalp and neck: Secondary | ICD-10-CM | POA: Diagnosis not present

## 2017-01-19 DIAGNOSIS — Z888 Allergy status to other drugs, medicaments and biological substances status: Secondary | ICD-10-CM | POA: Diagnosis not present

## 2017-01-19 DIAGNOSIS — Z8669 Personal history of other diseases of the nervous system and sense organs: Secondary | ICD-10-CM | POA: Diagnosis not present

## 2017-01-19 DIAGNOSIS — Z952 Presence of prosthetic heart valve: Secondary | ICD-10-CM | POA: Diagnosis not present

## 2017-01-19 DIAGNOSIS — I350 Nonrheumatic aortic (valve) stenosis: Secondary | ICD-10-CM | POA: Diagnosis not present

## 2017-01-19 DIAGNOSIS — N179 Acute kidney failure, unspecified: Secondary | ICD-10-CM | POA: Diagnosis not present

## 2017-01-19 DIAGNOSIS — R404 Transient alteration of awareness: Secondary | ICD-10-CM | POA: Diagnosis not present

## 2017-01-19 DIAGNOSIS — N390 Urinary tract infection, site not specified: Secondary | ICD-10-CM | POA: Diagnosis not present

## 2017-01-19 DIAGNOSIS — R634 Abnormal weight loss: Secondary | ICD-10-CM | POA: Diagnosis not present

## 2017-01-19 DIAGNOSIS — Z8673 Personal history of transient ischemic attack (TIA), and cerebral infarction without residual deficits: Secondary | ICD-10-CM | POA: Diagnosis not present

## 2017-01-19 DIAGNOSIS — I471 Supraventricular tachycardia: Secondary | ICD-10-CM | POA: Diagnosis present

## 2017-01-19 DIAGNOSIS — J90 Pleural effusion, not elsewhere classified: Secondary | ICD-10-CM | POA: Diagnosis not present

## 2017-01-19 DIAGNOSIS — R748 Abnormal levels of other serum enzymes: Secondary | ICD-10-CM | POA: Diagnosis not present

## 2017-01-19 DIAGNOSIS — D696 Thrombocytopenia, unspecified: Secondary | ICD-10-CM | POA: Diagnosis not present

## 2017-01-19 DIAGNOSIS — J449 Chronic obstructive pulmonary disease, unspecified: Secondary | ICD-10-CM | POA: Diagnosis present

## 2017-01-19 DIAGNOSIS — I2510 Atherosclerotic heart disease of native coronary artery without angina pectoris: Secondary | ICD-10-CM | POA: Diagnosis present

## 2017-01-19 DIAGNOSIS — E43 Unspecified severe protein-calorie malnutrition: Secondary | ICD-10-CM | POA: Diagnosis present

## 2017-01-19 DIAGNOSIS — F419 Anxiety disorder, unspecified: Secondary | ICD-10-CM | POA: Diagnosis present

## 2017-01-19 DIAGNOSIS — I442 Atrioventricular block, complete: Secondary | ICD-10-CM | POA: Diagnosis present

## 2017-01-19 DIAGNOSIS — I2583 Coronary atherosclerosis due to lipid rich plaque: Secondary | ICD-10-CM | POA: Diagnosis present

## 2017-01-19 DIAGNOSIS — M1A9XX0 Chronic gout, unspecified, without tophus (tophi): Secondary | ICD-10-CM | POA: Diagnosis present

## 2017-01-19 DIAGNOSIS — I130 Hypertensive heart and chronic kidney disease with heart failure and stage 1 through stage 4 chronic kidney disease, or unspecified chronic kidney disease: Secondary | ICD-10-CM | POA: Diagnosis present

## 2017-01-19 DIAGNOSIS — N200 Calculus of kidney: Secondary | ICD-10-CM | POA: Diagnosis not present

## 2017-01-19 DIAGNOSIS — C7A8 Other malignant neuroendocrine tumors: Secondary | ICD-10-CM | POA: Diagnosis present

## 2017-01-19 DIAGNOSIS — Z8546 Personal history of malignant neoplasm of prostate: Secondary | ICD-10-CM | POA: Diagnosis not present

## 2017-01-19 DIAGNOSIS — R0602 Shortness of breath: Secondary | ICD-10-CM | POA: Diagnosis not present

## 2017-01-19 DIAGNOSIS — I080 Rheumatic disorders of both mitral and aortic valves: Secondary | ICD-10-CM | POA: Diagnosis present

## 2017-01-19 DIAGNOSIS — R3915 Urgency of urination: Secondary | ICD-10-CM | POA: Diagnosis present

## 2017-01-19 DIAGNOSIS — Z7982 Long term (current) use of aspirin: Secondary | ICD-10-CM | POA: Diagnosis not present

## 2017-01-19 DIAGNOSIS — E785 Hyperlipidemia, unspecified: Secondary | ICD-10-CM | POA: Diagnosis not present

## 2017-01-19 DIAGNOSIS — I222 Subsequent non-ST elevation (NSTEMI) myocardial infarction: Secondary | ICD-10-CM | POA: Diagnosis not present

## 2017-01-19 DIAGNOSIS — C659 Malignant neoplasm of unspecified renal pelvis: Secondary | ICD-10-CM | POA: Diagnosis not present

## 2017-01-19 DIAGNOSIS — N183 Chronic kidney disease, stage 3 (moderate): Secondary | ICD-10-CM | POA: Diagnosis present

## 2017-01-19 DIAGNOSIS — E782 Mixed hyperlipidemia: Secondary | ICD-10-CM | POA: Diagnosis present

## 2017-01-19 DIAGNOSIS — Z950 Presence of cardiac pacemaker: Secondary | ICD-10-CM | POA: Diagnosis not present

## 2017-01-19 DIAGNOSIS — Z7902 Long term (current) use of antithrombotics/antiplatelets: Secondary | ICD-10-CM | POA: Diagnosis not present

## 2017-01-19 DIAGNOSIS — C669 Malignant neoplasm of unspecified ureter: Secondary | ICD-10-CM | POA: Diagnosis not present

## 2017-01-19 DIAGNOSIS — I051 Rheumatic mitral insufficiency: Secondary | ICD-10-CM | POA: Diagnosis not present

## 2017-01-19 DIAGNOSIS — R531 Weakness: Secondary | ICD-10-CM | POA: Diagnosis not present

## 2017-01-19 DIAGNOSIS — I517 Cardiomegaly: Secondary | ICD-10-CM | POA: Diagnosis not present

## 2017-01-19 DIAGNOSIS — Z6820 Body mass index (BMI) 20.0-20.9, adult: Secondary | ICD-10-CM | POA: Diagnosis not present

## 2017-01-19 DIAGNOSIS — R7989 Other specified abnormal findings of blood chemistry: Secondary | ICD-10-CM | POA: Diagnosis not present

## 2017-01-19 DIAGNOSIS — Z923 Personal history of irradiation: Secondary | ICD-10-CM | POA: Diagnosis not present

## 2017-01-19 DIAGNOSIS — N170 Acute kidney failure with tubular necrosis: Secondary | ICD-10-CM | POA: Diagnosis present

## 2017-01-19 DIAGNOSIS — I5022 Chronic systolic (congestive) heart failure: Secondary | ICD-10-CM | POA: Diagnosis present

## 2017-01-19 DIAGNOSIS — I10 Essential (primary) hypertension: Secondary | ICD-10-CM | POA: Diagnosis not present

## 2017-01-19 DIAGNOSIS — Z87442 Personal history of urinary calculi: Secondary | ICD-10-CM | POA: Diagnosis not present

## 2017-01-19 DIAGNOSIS — M1A00X0 Idiopathic chronic gout, unspecified site, without tophus (tophi): Secondary | ICD-10-CM | POA: Diagnosis not present

## 2017-01-19 DIAGNOSIS — R079 Chest pain, unspecified: Secondary | ICD-10-CM | POA: Diagnosis not present

## 2017-01-21 ENCOUNTER — Inpatient Hospital Stay: Payer: Medicare Other | Admitting: Family Medicine

## 2017-01-26 DIAGNOSIS — I130 Hypertensive heart and chronic kidney disease with heart failure and stage 1 through stage 4 chronic kidney disease, or unspecified chronic kidney disease: Secondary | ICD-10-CM | POA: Diagnosis not present

## 2017-01-26 DIAGNOSIS — I5020 Unspecified systolic (congestive) heart failure: Secondary | ICD-10-CM | POA: Diagnosis not present

## 2017-01-28 ENCOUNTER — Encounter: Payer: Self-pay | Admitting: Family Medicine

## 2017-01-28 ENCOUNTER — Ambulatory Visit (INDEPENDENT_AMBULATORY_CARE_PROVIDER_SITE_OTHER): Payer: Medicare Other | Admitting: Family Medicine

## 2017-01-28 VITALS — BP 96/54 | HR 98 | Temp 98.4°F | Wt 142.0 lb

## 2017-01-28 DIAGNOSIS — R7989 Other specified abnormal findings of blood chemistry: Secondary | ICD-10-CM

## 2017-01-28 DIAGNOSIS — R2990 Unspecified symptoms and signs involving the nervous system: Secondary | ICD-10-CM

## 2017-01-28 DIAGNOSIS — I10 Essential (primary) hypertension: Secondary | ICD-10-CM | POA: Diagnosis not present

## 2017-01-28 DIAGNOSIS — M1000 Idiopathic gout, unspecified site: Secondary | ICD-10-CM | POA: Diagnosis not present

## 2017-01-28 DIAGNOSIS — N179 Acute kidney failure, unspecified: Secondary | ICD-10-CM

## 2017-01-28 DIAGNOSIS — J8410 Pulmonary fibrosis, unspecified: Secondary | ICD-10-CM | POA: Diagnosis not present

## 2017-01-28 DIAGNOSIS — R945 Abnormal results of liver function studies: Secondary | ICD-10-CM

## 2017-01-28 MED ORDER — FUROSEMIDE 20 MG PO TABS: ORAL_TABLET | ORAL | 3 refills | Status: AC

## 2017-01-28 NOTE — Progress Notes (Signed)
BP (!) 96/54   Pulse 98   Temp 98.4 F (36.9 C)   Wt 142 lb (64.4 kg)   SpO2 100%   BMI 19.80 kg/m    Subjective:    Patient ID: Andrew Ali, male    DOB: 1933/06/19, 83 y.o.   MRN: [id number]  HPI: Andrew Ali is a 83 y.o. male  Chief Complaint  Patient presents with  . Hospitalization Follow-up    Was admitted for multiple complaints. SOB, Hematuria, Chest Pain, Elevated Liver Enzymes, AKI, R Ureteral CA. He states he still feels "rotten".  . Medication Refill    Needs 90 day supply on the Allopurinol, Remeron, Flomax, Oxybutin sent to [facility].   Patient with hx significant for pulmonary fibrosis, gout, HTN, possible previous seizure activity, and prostate cancer presents today for hospital admission f/u d/t multiple concerns. Was admitted 6/30 with AKI and failure to thrive, found to have elevated liver enzymes and hematuria during admission. Lamictal, plavix, and atorvastatin were d/c'd in hospital d/t reduced liver and kidney function, and allopurinol was reduced to 100 mg. He was also found to have increasing anxiety and SOB sxs, started on Remeron and melatonin was recommended. Has not yet started the melatonin.   Past Medical History:  Diagnosis Date  . Allergy   . Aortic stenosis   . Arthritis   . Cancer [facility])    prostate  . GERD (gastroesophageal reflux disease)   . History of kidney stones   . Hyperlipidemia   . Hypertension   . Pulmonary fibrosis [facility])    Social History   Social History  . Marital status: Married    Spouse name: N/A  . Number of children: N/A  . Years of education: N/A   Occupational History  . Not on file.   Social History Main Topics  . Smoking status: Never Smoker  . Smokeless tobacco: Never Used  . Alcohol use Yes  . Drug use: No  . Sexual activity: Not on file   Other Topics Concern  . Not on file   Social History Narrative  . No narrative on file    Relevant past medical, surgical, family and social history  reviewed and updated as indicated. Interim medical history since our last visit reviewed. Allergies and medications reviewed and updated.  Review of Systems  Constitutional: Positive for fatigue.  Eyes: Negative.   Respiratory: Positive for shortness of breath.   Cardiovascular: Positive for leg swelling.  Gastrointestinal: Negative.   Genitourinary: Positive for hematuria.  Musculoskeletal: Negative.   Neurological: Negative.   Psychiatric/Behavioral: Positive for sleep disturbance. The patient is nervous/anxious.     Per HPI unless specifically indicated above     Objective:    BP (!) 96/54   Pulse 98   Temp 98.4 F (36.9 C)   Wt 142 lb (64.4 kg)   SpO2 100%   BMI 19.80 kg/m   Wt Readings from Last 3 Encounters:  01/28/17 142 lb (64.4 kg)  12/19/16 136 lb (61.7 kg)  11/06/16 131 lb (59.4 kg)    Physical Exam  Constitutional: He is oriented to person, place, and time. He appears well-developed and well-nourished. No distress.  HENT:  Head: Atraumatic.  Eyes: Pupils are equal, round, and reactive to light. Conjunctivae are normal.  Neck: Normal range of motion. Neck supple.  Pulmonary/Chest: He has no wheezes. He has no rales.  Breathless after full sentences  Musculoskeletal: He exhibits edema (2+ pitting edema b/l LEs). He exhibits  no tenderness.  Neurological: He is alert and oriented to person, place, and time.  Skin: Skin is warm and dry.  Psychiatric: He has a normal mood and affect. His behavior is normal.  Nursing note and vitals reviewed.   Results for orders placed or performed in visit on 01/28/17  CBC with Differential/Platelet  Result Value Ref Range   WBC 5.9 3.4 - 10.8 x10E3/uL   RBC 4.31 4.14 - 5.80 x10E6/uL   Hemoglobin 12.3 (L) 13.0 - 17.7 g/dL   Hematocrit 40.7 37.5 - 51.0 %   MCV 94 79 - 97 fL   MCH 28.5 26.6 - 33.0 pg   MCHC 30.2 (L) 31.5 - 35.7 g/dL   RDW 18.8 (H) 12.3 - 15.4 %   Platelets 73 (LL) 150 - 379 x10E3/uL   Neutrophils 76  Not Estab. %   Lymphs 16 Not Estab. %   Monocytes 7 Not Estab. %   Eos 0 Not Estab. %   Basos 1 Not Estab. %   Neutrophils Absolute 4.4 1.4 - 7.0 x10E3/uL   Lymphocytes Absolute 0.9 0.7 - 3.1 x10E3/uL   Monocytes Absolute 0.4 0.1 - 0.9 x10E3/uL   EOS (ABSOLUTE) 0.0 0.0 - 0.4 x10E3/uL   Basophils Absolute 0.0 0.0 - 0.2 x10E3/uL   Immature Granulocytes 0 Not Estab. %   Immature Grans (Abs) 0.0 0.0 - 0.1 x10E3/uL   Hematology Comments: Note:   Comprehensive metabolic panel  Result Value Ref Range   Glucose 92 65 - 99 mg/dL   BUN 41 (H) 8 - 27 mg/dL   Creatinine, Ser 1.57 (H) 0.76 - 1.27 mg/dL   GFR calc non Af Amer 41 (L) >59 mL/min/1.73   GFR calc Af Amer 47 (L) >59 mL/min/1.73   BUN/Creatinine Ratio 26 (H) 10 - 24   Sodium 144 134 - 144 mmol/L   Potassium 4.5 3.5 - 5.2 mmol/L   Chloride 105 96 - 106 mmol/L   CO2 21 20 - 29 mmol/L   Calcium 10.2 8.6 - 10.2 mg/dL   Total Protein 6.0 6.0 - 8.5 g/dL   Albumin 3.9 3.5 - 4.7 g/dL   Globulin, Total 2.1 1.5 - 4.5 g/dL   Albumin/Globulin Ratio 1.9 1.2 - 2.2   Bilirubin Total 1.5 (H) 0.0 - 1.2 mg/dL   Alkaline Phosphatase 181 (H) 39 - 117 IU/L   AST 47 (H) 0 - 40 IU/L   ALT 78 (H) 0 - 44 IU/L      Assessment & Plan:   Problem List Items Addressed This Visit      Cardiovascular and Mediastinum   Hypertension    BP fairly low today, continue current regimen with only adding coreg when readings significantly elevated. Discussed importance of close monitoring as he doses his lasix. F/u with abnormal readings      Relevant Medications   furosemide (LASIX) 20 MG tablet     Respiratory   Pulmonary fibrosis (HCC)    SOB but at baseline.         Other   Episode of transient neurologic symptoms    Continue to hold lamictal given elevated LFTs. Will re-evaluate at 1 month f/u      Gout    Continue decreased dose of 100 mg allopurinol. Watch carefully for gout sxs to return. Will re-evaluate at f/u       Other Visit Diagnoses     AKI (acute kidney injury) ([place])    -  Primary   Will continue to monitor closely.  Discussed delicate balance of staying hydrated vs keeping leg swelling down with lasix.    Relevant Orders   CBC with Differential/Platelet (Completed)   Comprehensive metabolic panel (Completed)   Elevated LFTs       Continue hold of atorvastatin, lamictal, and plavix. Will recheck at 1 month f/u.        Follow up plan: Return for as scheduled.

## 2017-01-29 ENCOUNTER — Telehealth: Payer: Self-pay | Admitting: Family Medicine

## 2017-01-29 DIAGNOSIS — I5020 Unspecified systolic (congestive) heart failure: Secondary | ICD-10-CM | POA: Diagnosis not present

## 2017-01-29 DIAGNOSIS — I130 Hypertensive heart and chronic kidney disease with heart failure and stage 1 through stage 4 chronic kidney disease, or unspecified chronic kidney disease: Secondary | ICD-10-CM | POA: Diagnosis not present

## 2017-01-29 LAB — CBC WITH DIFFERENTIAL/PLATELET
Basophils Absolute: 0 10*3/uL (ref 0.0–0.2)
Basos: 1 %
EOS (ABSOLUTE): 0 10*3/uL (ref 0.0–0.4)
Eos: 0 %
HEMOGLOBIN: 12.3 g/dL — AB (ref 13.0–17.7)
Hematocrit: 40.7 % (ref 37.5–51.0)
IMMATURE GRANS (ABS): 0 10*3/uL (ref 0.0–0.1)
Immature Granulocytes: 0 %
LYMPHS ABS: 0.9 10*3/uL (ref 0.7–3.1)
LYMPHS: 16 %
MCH: 28.5 pg (ref 26.6–33.0)
MCHC: 30.2 g/dL — ABNORMAL LOW (ref 31.5–35.7)
MCV: 94 fL (ref 79–97)
MONOCYTES: 7 %
Monocytes Absolute: 0.4 10*3/uL (ref 0.1–0.9)
Neutrophils Absolute: 4.4 10*3/uL (ref 1.4–7.0)
Neutrophils: 76 %
PLATELETS: 73 10*3/uL — AB (ref 150–379)
RBC: 4.31 x10E6/uL (ref 4.14–5.80)
RDW: 18.8 % — ABNORMAL HIGH (ref 12.3–15.4)
WBC: 5.9 10*3/uL (ref 3.4–10.8)

## 2017-01-29 LAB — COMPREHENSIVE METABOLIC PANEL
ALT: 78 IU/L — AB (ref 0–44)
AST: 47 IU/L — ABNORMAL HIGH (ref 0–40)
Albumin/Globulin Ratio: 1.9 (ref 1.2–2.2)
Albumin: 3.9 g/dL (ref 3.5–4.7)
Alkaline Phosphatase: 181 IU/L — ABNORMAL HIGH (ref 39–117)
BUN / CREAT RATIO: 26 — AB (ref 10–24)
BUN: 41 mg/dL — AB (ref 8–27)
Bilirubin Total: 1.5 mg/dL — ABNORMAL HIGH (ref 0.0–1.2)
CALCIUM: 10.2 mg/dL (ref 8.6–10.2)
CO2: 21 mmol/L (ref 20–29)
CREATININE: 1.57 mg/dL — AB (ref 0.76–1.27)
Chloride: 105 mmol/L (ref 96–106)
GFR calc Af Amer: 47 mL/min/{1.73_m2} — ABNORMAL LOW (ref >59)
GFR, EST NON AFRICAN AMERICAN: 41 mL/min/{1.73_m2} — AB (ref >59)
GLUCOSE: 92 mg/dL (ref 65–99)
Globulin, Total: 2.1 g/dL (ref 1.5–4.5)
Potassium: 4.5 mmol/L (ref 3.5–5.2)
Sodium: 144 mmol/L (ref 134–144)
TOTAL PROTEIN: 6 g/dL (ref 6.0–8.5)

## 2017-01-29 NOTE — Telephone Encounter (Signed)
Please call and let him know his labs are about the same from admission. Will repeat them at his upcoming follow up unless he has any issues in the meantime

## 2017-01-29 NOTE — Telephone Encounter (Signed)
Patient notified of lab results

## 2017-01-31 ENCOUNTER — Telehealth: Payer: Self-pay | Admitting: Family Medicine

## 2017-01-31 DIAGNOSIS — I130 Hypertensive heart and chronic kidney disease with heart failure and stage 1 through stage 4 chronic kidney disease, or unspecified chronic kidney disease: Secondary | ICD-10-CM | POA: Diagnosis not present

## 2017-01-31 DIAGNOSIS — I5020 Unspecified systolic (congestive) heart failure: Secondary | ICD-10-CM | POA: Diagnosis not present

## 2017-01-31 NOTE — Telephone Encounter (Signed)
Please see message from front desk staff. Please advise, will route to [facility] as well as Dr. Jeananne Rama as Apolonio Schneiders saw pt for hospital f/u recently.

## 2017-01-31 NOTE — Assessment & Plan Note (Signed)
BP fairly low today, continue current regimen with only adding coreg when readings significantly elevated. Discussed importance of close monitoring as he doses his lasix. F/u with abnormal readings

## 2017-01-31 NOTE — Assessment & Plan Note (Signed)
Continue decreased dose of 100 mg allopurinol. Watch carefully for gout sxs to return. Will re-evaluate at f/u

## 2017-01-31 NOTE — Assessment & Plan Note (Signed)
Continue to hold lamictal given elevated LFTs. Will re-evaluate at 1 month f/u

## 2017-01-31 NOTE — Assessment & Plan Note (Signed)
SOB but at baseline.

## 2017-01-31 NOTE — Telephone Encounter (Signed)
Annette with [facility] called regarding the patient is having difficulty with his breathing even on resting.  She is wanting to pass the information by Dr Jeananne Rama to see if he had ever discussed hospice care with him.  She can be reached at [phone] [facility]

## 2017-02-01 ENCOUNTER — Encounter: Payer: Self-pay | Admitting: Family Medicine

## 2017-02-01 NOTE — Telephone Encounter (Signed)
Please call and let them know that we are happy to have this discussion with him and I would recommend he go ahead and get in with Dr. Jeananne Rama as soon as he gets back from his vacation but if he wants to come in sooner, Malachy Mood said she would be happy to go through everything with him

## 2017-02-01 NOTE — Telephone Encounter (Signed)
I spoke with Anne Ng, she says that the patient brought up possibly starting Hospice care. He wanted to think about it over the weekend and they will call him Monday to find out his decision. I advised her that we are in agreement and that we'd be happy to sign any orders for Hospice care. She will let us know his decision on Monday.

## 2017-02-07 ENCOUNTER — Telehealth: Payer: Self-pay

## 2017-02-07 DIAGNOSIS — Z950 Presence of cardiac pacemaker: Secondary | ICD-10-CM | POA: Diagnosis not present

## 2017-02-07 DIAGNOSIS — N289 Disorder of kidney and ureter, unspecified: Secondary | ICD-10-CM | POA: Diagnosis not present

## 2017-02-07 DIAGNOSIS — I10 Essential (primary) hypertension: Secondary | ICD-10-CM | POA: Diagnosis not present

## 2017-02-07 DIAGNOSIS — C61 Malignant neoplasm of prostate: Secondary | ICD-10-CM | POA: Diagnosis not present

## 2017-02-07 DIAGNOSIS — J8410 Pulmonary fibrosis, unspecified: Secondary | ICD-10-CM

## 2017-02-07 DIAGNOSIS — D696 Thrombocytopenia, unspecified: Secondary | ICD-10-CM | POA: Diagnosis not present

## 2017-02-07 DIAGNOSIS — Z952 Presence of prosthetic heart valve: Secondary | ICD-10-CM | POA: Diagnosis not present

## 2017-02-07 NOTE — Telephone Encounter (Signed)
Pt was seen by [facility] today. (Note has not yet been completed - checked via care everywhere) Daughter, Andrew Ali, stated that pt is not doing well. Pt has no appetite. His anxiety is getting severe and he is not getting any sleep. Daughter stated that pt was getting very irritated when they were driving home from [place] and yelled at all the trees and light posts they past. Pt is having more and more anxiety attacks that last 20-30 minutes with heavy breathing and shaking.  Scheduled pt with Dr. Jeananne Rama on Tuesday, but daughter would like to know if they can get anything for over the week, she is afraid they will end up back in the E[place]R[facility] This weekend. Please advise.

## 2017-02-08 DIAGNOSIS — I130 Hypertensive heart and chronic kidney disease with heart failure and stage 1 through stage 4 chronic kidney disease, or unspecified chronic kidney disease: Secondary | ICD-10-CM | POA: Diagnosis not present

## 2017-02-08 DIAGNOSIS — I5020 Unspecified systolic (congestive) heart failure: Secondary | ICD-10-CM | POA: Diagnosis not present

## 2017-02-11 MED ORDER — CLONAZEPAM 0.5 MG PO TABS: 0.5000 mg | ORAL_TABLET | Freq: Two times a day (BID) | ORAL | 2 refills | Status: AC | PRN

## 2017-02-11 NOTE — Telephone Encounter (Signed)
Phone call Discussed with patient's severe anxiety with patient's severe pulmonary fibrosis and lack of ability for self-care. We'll start clonazepam 0.5mg  1 or 2 a day as needed for severe anxiety. Also discussed referral for palliative care. Will contact home health and/or hospice to begin palliative care. Reminder patient lives in [place]

## 2017-02-11 NOTE — Telephone Encounter (Signed)
[facility] called back and stated they had no availability. Referral was faxed to:   [facility] [address] [place], [place], [place] [address] Director: Skip Estimable  [phone]

## 2017-02-11 NOTE — Telephone Encounter (Signed)
Need referral for palliative care patient lives in [place]

## 2017-02-11 NOTE — Telephone Encounter (Signed)
Patient was transferred to provider for telephone conversation.   

## 2017-02-11 NOTE — Telephone Encounter (Signed)
Call pt 

## 2017-02-11 NOTE — Addendum Note (Signed)
Addended by: Golden Pop A on: 02/11/2017 12:15 PM   Modules accepted: Orders

## 2017-02-11 NOTE — Addendum Note (Signed)
Addended by: Gerda Diss A on: 02/11/2017 12:52 PM   Modules accepted: Orders

## 2017-02-11 NOTE — Telephone Encounter (Signed)
Referral faxed to:     [facility] [place] Phone: [phone] FAX: [phone]

## 2017-02-12 ENCOUNTER — Telehealth: Payer: Self-pay | Admitting: Family Medicine

## 2017-02-12 NOTE — Telephone Encounter (Signed)
Please see telephone message prior to this one. Andrew Ali called and stated they do no to palliative care. They do not know anyone other then [facility] that do. Will place [facility] referral to see if they can help.

## 2017-02-12 NOTE — Addendum Note (Signed)
Addended by: Gerda Diss A on: 02/12/2017 10:52 AM   Modules accepted: Orders

## 2017-02-12 NOTE — Telephone Encounter (Signed)
Please see prior telephone encounter.

## 2017-02-13 ENCOUNTER — Ambulatory Visit: Payer: Self-pay | Admitting: Family Medicine

## 2017-02-14 DIAGNOSIS — Z79899 Other long term (current) drug therapy: Secondary | ICD-10-CM | POA: Diagnosis not present

## 2017-02-14 DIAGNOSIS — C7A8 Other malignant neuroendocrine tumors: Secondary | ICD-10-CM | POA: Diagnosis not present

## 2017-02-14 DIAGNOSIS — Z5181 Encounter for therapeutic drug level monitoring: Secondary | ICD-10-CM | POA: Diagnosis not present

## 2017-02-19 ENCOUNTER — Telehealth: Payer: Self-pay | Admitting: Family Medicine

## 2017-02-19 DIAGNOSIS — I5023 Acute on chronic systolic (congestive) heart failure: Secondary | ICD-10-CM | POA: Diagnosis not present

## 2017-02-19 DIAGNOSIS — R0609 Other forms of dyspnea: Secondary | ICD-10-CM | POA: Diagnosis not present

## 2017-02-19 DIAGNOSIS — K8020 Calculus of gallbladder without cholecystitis without obstruction: Secondary | ICD-10-CM | POA: Diagnosis not present

## 2017-02-19 DIAGNOSIS — R112 Nausea with vomiting, unspecified: Secondary | ICD-10-CM | POA: Diagnosis not present

## 2017-02-19 DIAGNOSIS — R0602 Shortness of breath: Secondary | ICD-10-CM | POA: Diagnosis not present

## 2017-02-19 DIAGNOSIS — N200 Calculus of kidney: Secondary | ICD-10-CM | POA: Diagnosis not present

## 2017-02-19 DIAGNOSIS — I350 Nonrheumatic aortic (valve) stenosis: Secondary | ICD-10-CM | POA: Diagnosis not present

## 2017-02-19 DIAGNOSIS — Z681 Body mass index (BMI) 19 or less, adult: Secondary | ICD-10-CM | POA: Diagnosis not present

## 2017-02-19 DIAGNOSIS — I493 Ventricular premature depolarization: Secondary | ICD-10-CM | POA: Diagnosis not present

## 2017-02-19 DIAGNOSIS — C61 Malignant neoplasm of prostate: Secondary | ICD-10-CM | POA: Diagnosis not present

## 2017-02-19 DIAGNOSIS — R64 Cachexia: Secondary | ICD-10-CM | POA: Diagnosis not present

## 2017-02-19 DIAGNOSIS — I130 Hypertensive heart and chronic kidney disease with heart failure and stage 1 through stage 4 chronic kidney disease, or unspecified chronic kidney disease: Secondary | ICD-10-CM | POA: Diagnosis not present

## 2017-02-19 DIAGNOSIS — Z66 Do not resuscitate: Secondary | ICD-10-CM | POA: Diagnosis not present

## 2017-02-19 DIAGNOSIS — I959 Hypotension, unspecified: Secondary | ICD-10-CM | POA: Diagnosis not present

## 2017-02-19 DIAGNOSIS — I509 Heart failure, unspecified: Secondary | ICD-10-CM | POA: Diagnosis not present

## 2017-02-19 DIAGNOSIS — J9811 Atelectasis: Secondary | ICD-10-CM | POA: Diagnosis not present

## 2017-02-19 DIAGNOSIS — I340 Nonrheumatic mitral (valve) insufficiency: Secondary | ICD-10-CM | POA: Diagnosis not present

## 2017-02-19 DIAGNOSIS — R5383 Other fatigue: Secondary | ICD-10-CM | POA: Diagnosis not present

## 2017-02-19 DIAGNOSIS — N189 Chronic kidney disease, unspecified: Secondary | ICD-10-CM | POA: Diagnosis not present

## 2017-02-19 DIAGNOSIS — J449 Chronic obstructive pulmonary disease, unspecified: Secondary | ICD-10-CM | POA: Diagnosis not present

## 2017-02-19 DIAGNOSIS — Z114 Encounter for screening for human immunodeficiency virus [HIV]: Secondary | ICD-10-CM | POA: Diagnosis not present

## 2017-02-19 DIAGNOSIS — N179 Acute kidney failure, unspecified: Secondary | ICD-10-CM | POA: Diagnosis not present

## 2017-02-19 DIAGNOSIS — C669 Malignant neoplasm of unspecified ureter: Secondary | ICD-10-CM | POA: Diagnosis not present

## 2017-02-19 DIAGNOSIS — R1084 Generalized abdominal pain: Secondary | ICD-10-CM | POA: Diagnosis not present

## 2017-02-19 DIAGNOSIS — I110 Hypertensive heart disease with heart failure: Secondary | ICD-10-CM | POA: Diagnosis not present

## 2017-02-19 DIAGNOSIS — J90 Pleural effusion, not elsewhere classified: Secondary | ICD-10-CM | POA: Diagnosis not present

## 2017-02-19 DIAGNOSIS — D696 Thrombocytopenia, unspecified: Secondary | ICD-10-CM | POA: Diagnosis not present

## 2017-02-19 DIAGNOSIS — J811 Chronic pulmonary edema: Secondary | ICD-10-CM | POA: Diagnosis not present

## 2017-02-19 DIAGNOSIS — E785 Hyperlipidemia, unspecified: Secondary | ICD-10-CM | POA: Diagnosis not present

## 2017-02-19 DIAGNOSIS — E8770 Fluid overload, unspecified: Secondary | ICD-10-CM | POA: Diagnosis not present

## 2017-02-19 NOTE — Telephone Encounter (Signed)
Both husband and wife would like to know if hospice is coming to [place] and also if they need to contact hospice. Both husband and wife would like to speak with provider regarding the process with hospice.   Please Advise.  Thank you

## 2017-02-19 NOTE — Telephone Encounter (Addendum)
Called [facility] to follow up. [name], at [facility], was going to follow up with [facility] to see if they could help. On recommendation of Dr. Jeananne Rama I reached out to [facility]. They will review referral and let us know if they can see patient.  Info was faxed to [facility] at [phone]. [name] then called back and asked me to fax more [place] to [place]. That info was faxed to [phone]

## 2017-02-19 NOTE — Telephone Encounter (Signed)
Spoke with Corey Skains who informed me that [facility] was not accepting new pts. (see prev encounter) I called Hospice to see if there were any other organizations other than [facility] or [facility] and the intake coordinator asked for more info on the patient to see if they could get it approved to admit Andrew Ali into Hospice. Spoke with Corey Skains & she will fax recent notes from Dr. Jeananne Rama along with all diagnosis (end stage Pulmonary Fibrosis) to Rickey Barbara at [phone]. knb

## 2017-02-19 NOTE — Telephone Encounter (Signed)
Please see telephone note from 02/07/17. Pt and wife aware that we are still working on referral.

## 2017-02-20 DIAGNOSIS — K8010 Calculus of gallbladder with chronic cholecystitis without obstruction: Secondary | ICD-10-CM | POA: Diagnosis not present

## 2017-02-20 DIAGNOSIS — N200 Calculus of kidney: Secondary | ICD-10-CM | POA: Diagnosis not present

## 2017-02-20 DIAGNOSIS — Z85828 Personal history of other malignant neoplasm of skin: Secondary | ICD-10-CM | POA: Diagnosis not present

## 2017-02-20 DIAGNOSIS — R931 Abnormal findings on diagnostic imaging of heart and coronary circulation: Secondary | ICD-10-CM | POA: Diagnosis not present

## 2017-02-20 DIAGNOSIS — J90 Pleural effusion, not elsewhere classified: Secondary | ICD-10-CM | POA: Diagnosis not present

## 2017-02-20 DIAGNOSIS — I361 Nonrheumatic tricuspid (valve) insufficiency: Secondary | ICD-10-CM | POA: Diagnosis not present

## 2017-02-20 DIAGNOSIS — N179 Acute kidney failure, unspecified: Secondary | ICD-10-CM | POA: Diagnosis not present

## 2017-02-20 DIAGNOSIS — R5381 Other malaise: Secondary | ICD-10-CM | POA: Diagnosis not present

## 2017-02-20 DIAGNOSIS — Z952 Presence of prosthetic heart valve: Secondary | ICD-10-CM | POA: Diagnosis not present

## 2017-02-20 DIAGNOSIS — I509 Heart failure, unspecified: Secondary | ICD-10-CM | POA: Diagnosis not present

## 2017-02-20 DIAGNOSIS — Z515 Encounter for palliative care: Secondary | ICD-10-CM | POA: Diagnosis not present

## 2017-02-20 DIAGNOSIS — Z950 Presence of cardiac pacemaker: Secondary | ICD-10-CM | POA: Diagnosis not present

## 2017-02-20 DIAGNOSIS — I340 Nonrheumatic mitral (valve) insufficiency: Secondary | ICD-10-CM | POA: Diagnosis not present

## 2017-02-20 DIAGNOSIS — R570 Cardiogenic shock: Secondary | ICD-10-CM | POA: Diagnosis not present

## 2017-02-20 DIAGNOSIS — R7989 Other specified abnormal findings of blood chemistry: Secondary | ICD-10-CM | POA: Diagnosis not present

## 2017-02-20 DIAGNOSIS — I517 Cardiomegaly: Secondary | ICD-10-CM | POA: Diagnosis not present

## 2017-02-20 DIAGNOSIS — R262 Difficulty in walking, not elsewhere classified: Secondary | ICD-10-CM | POA: Diagnosis not present

## 2017-02-20 DIAGNOSIS — Z452 Encounter for adjustment and management of vascular access device: Secondary | ICD-10-CM | POA: Diagnosis not present

## 2017-02-20 NOTE — Telephone Encounter (Signed)
Andrew Ali from [facility] care and hospice called to inform CMA that hey cn take hospice referral but found out patient was admitted to [facility] yesterday and they will go ahead and follow-up with patient upon discharge.

## 2017-02-20 NOTE — Telephone Encounter (Signed)
[facility] is going to see patient upon discharge from hospital.

## 2017-02-21 ENCOUNTER — Other Ambulatory Visit: Payer: Self-pay | Admitting: *Deleted

## 2017-02-21 ENCOUNTER — Telehealth: Payer: Self-pay

## 2017-02-21 NOTE — Patient Outreach (Signed)
[facility]) Care Management  16-Mar-2017  Andrew Ali 01/10/1933 [id number]  Referral via EMMI-Prevent:  Per chart review, patient is deceased as of today March 16, 2017.  Plan: Close case. Sent to care management assistant.  Sherrin Daisy, RN BSN [place] Management Coordinator [facility] Care Management  [phone]

## 2017-02-27 ENCOUNTER — Ambulatory Visit: Payer: Medicare Other | Admitting: Family Medicine

## 2017-03-11 ENCOUNTER — Ambulatory Visit: Payer: Medicare Other | Admitting: Family Medicine

## 2017-03-23 DEATH — deceased

## 2017-04-26 IMAGING — MR MR HEAD WO/W CM
10 of 12 series · 38 of 48 positions shown · IV contrast (multihance)
Comparison: None.

CLINICAL DATA: Hashimoto of transient neurologic symptoms
([id number]-CM) TIA

EXAM:
MRI HEAD WITHOUT AND WITH CONTRAST
TECHNIQUE: Multiplanar, multiecho pulse sequences of the brain and surrounding
structures were obtained without and with intravenous contrast.
CONTRAST:  14mL MULTIHANCE GADOBENATE DIMEGLUMINE 529 MG/ML IV SOLN

[Series 4: DWI · axial · 4.0mm · 0.94mm/px · z∈[-39,+135]mm · 5 of 45 slices shown (1 of 4)]
[im 1/45]
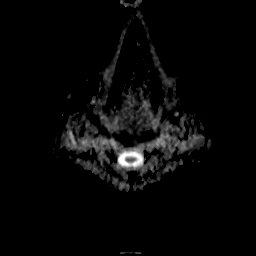
[im 12/45]
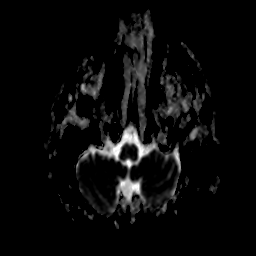
[im 23/45]
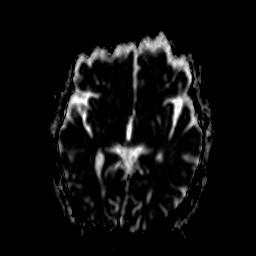
[im 34/45]
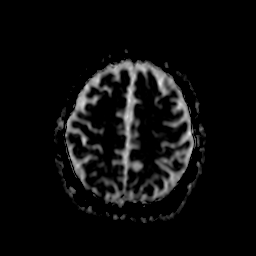
[im 45/45]
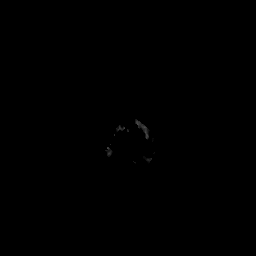

[Series 5: DWI · axial · 4.0mm · 0.94mm/px · z∈[-35,+135]mm · 5 of 42 slices shown (2 of 4)]
[im 1/42]
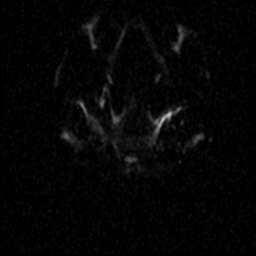
[im 11/42]
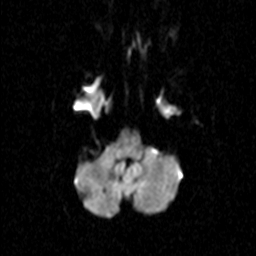
[im 21/42]
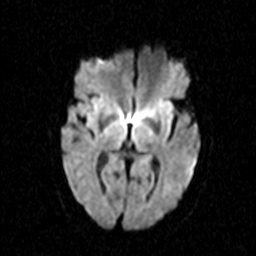
[im 31/42]
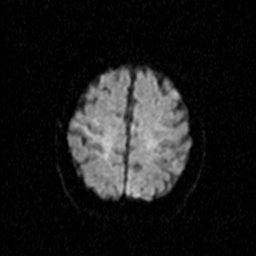
[im 42/42]
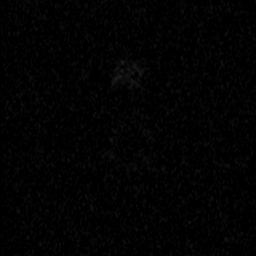

[Series 7: DWI · coronal · 5.0mm · 1.80mm/px · 4 of 40 slices shown (3 of 4)]
[im 1/40]
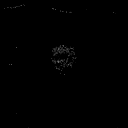
[im 14/40]
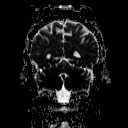
[im 27/40]
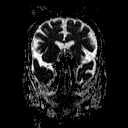
[im 40/40]
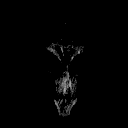

[Series 8: DWI · coronal · 5.0mm · 1.80mm/px · 4 of 37 slices shown (4 of 4)]
[im 1/37]
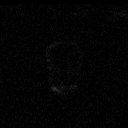
[im 13/37]
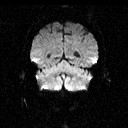
[im 25/37]
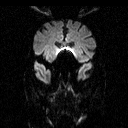
[im 37/37]
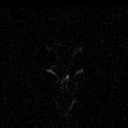

[Series 9: T2 · axial · 5.0mm · 0.45mm/px · z∈[-30,+137]mm · 3 of 27 slices shown (1 of 2)]
[im 1/27]
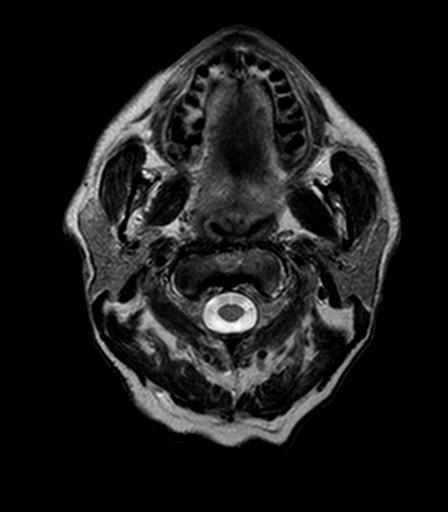
[im 14/27]
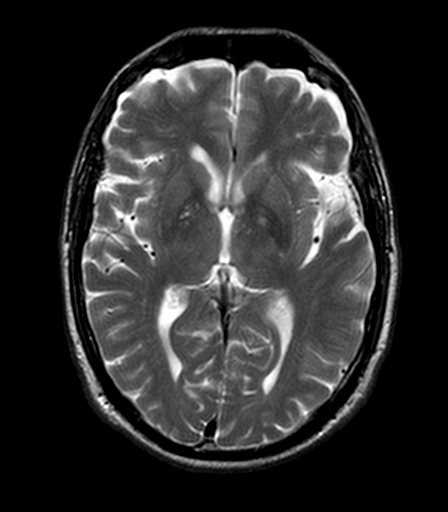
[im 27/27]
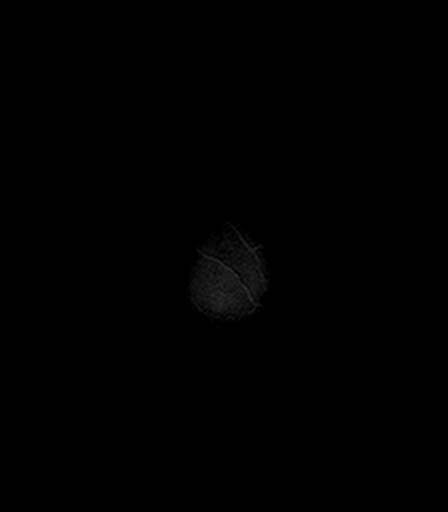

[Series 10: FLAIR · axial · 5.0mm · 0.90mm/px · z∈[-31,+137]mm · 3 of 27 slices shown]
[im 1/27]
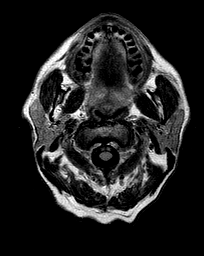
[im 14/27]
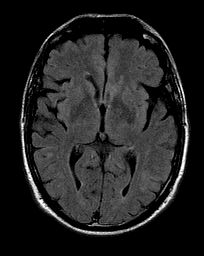
[im 27/27]
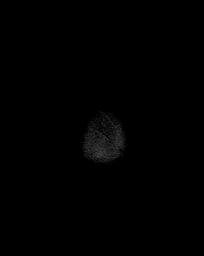

[Series 11: T2 · axial · 5.0mm · 0.45mm/px · z∈[-30,+137]mm · 3 of 27 slices shown (2 of 2)]
[im 1/27]
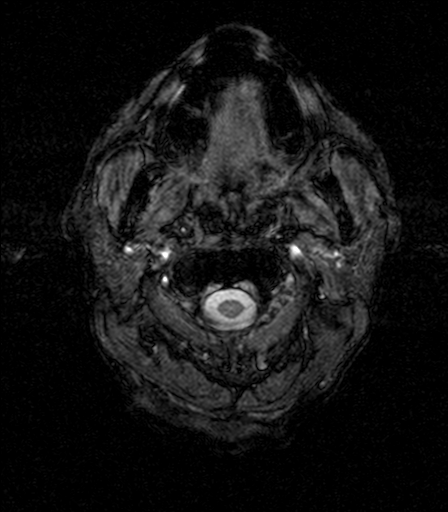
[im 14/27]
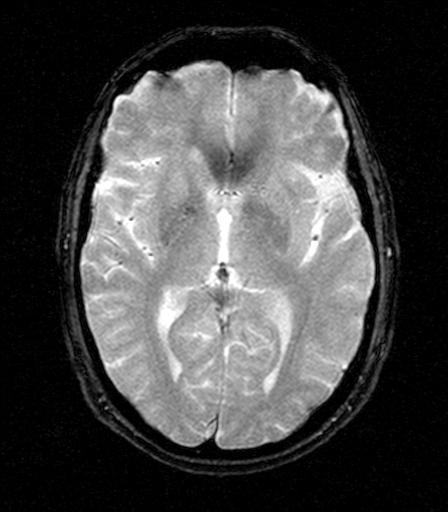
[im 27/27]
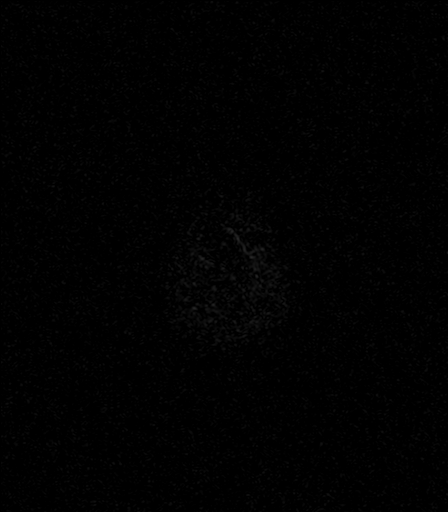

[Series 13: T2 post-contrast · coronal · 5.0mm · 0.45mm/px · 2 of 31 slices shown]
[im 1/31]
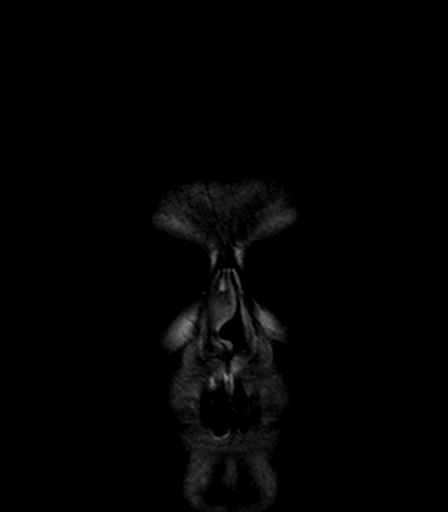
[im 16/31]
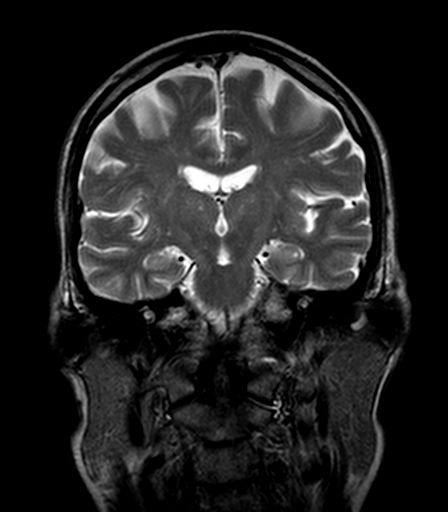

[Series 14: T1 post-contrast · axial · 3.0mm · 0.45mm/px · z∈[-29,+135]mm · 6 of 56 slices shown (1 of 2)]
[im 1/56]
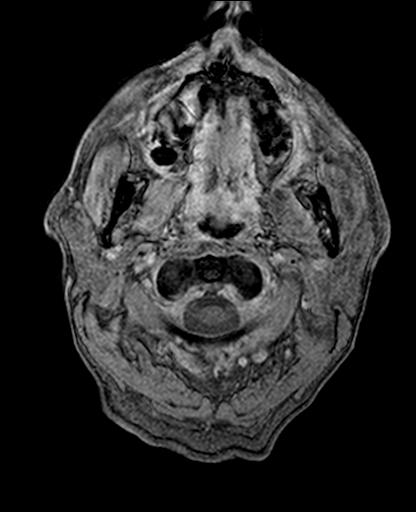
[im 12/56]
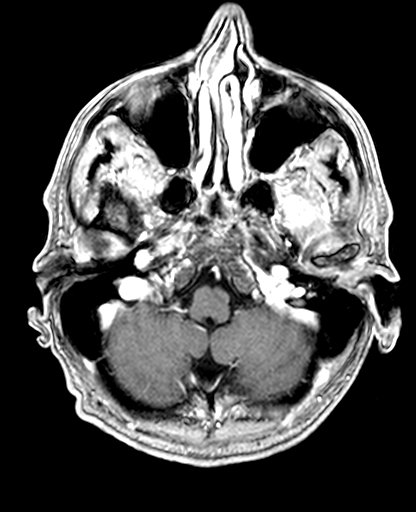
[im 23/56]
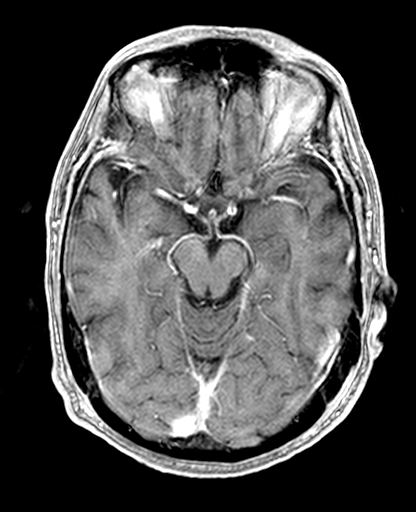
[im 34/56]
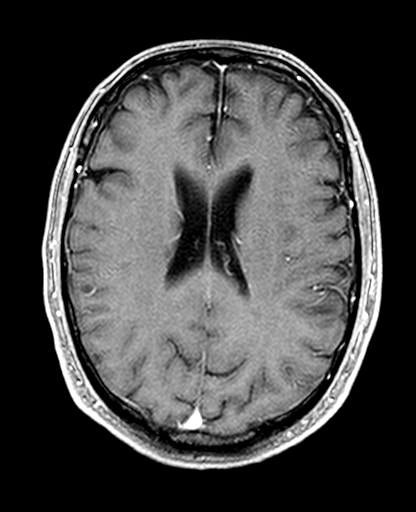
[im 45/56]
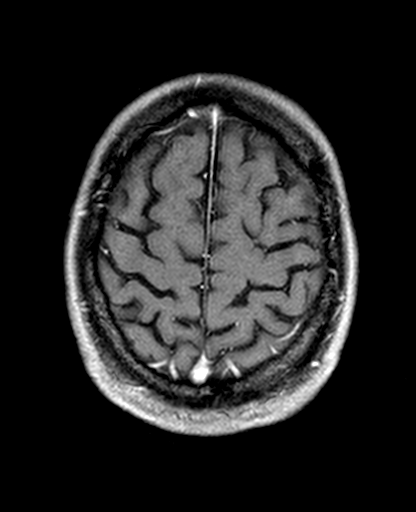
[im 56/56]
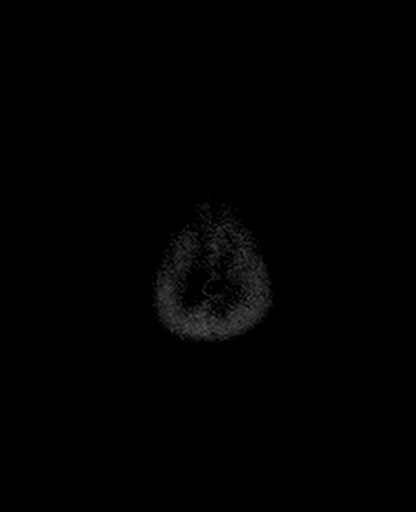

[Series 15: T1 post-contrast · coronal · 5.0mm · 0.45mm/px · 3 of 31 slices shown (2 of 2)]
[im 1/31]
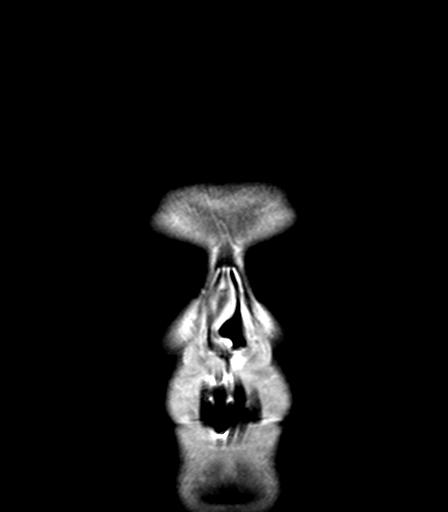
[im 16/31]
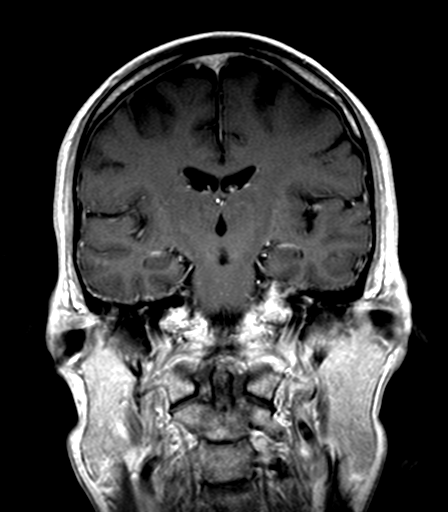
[im 31/31]
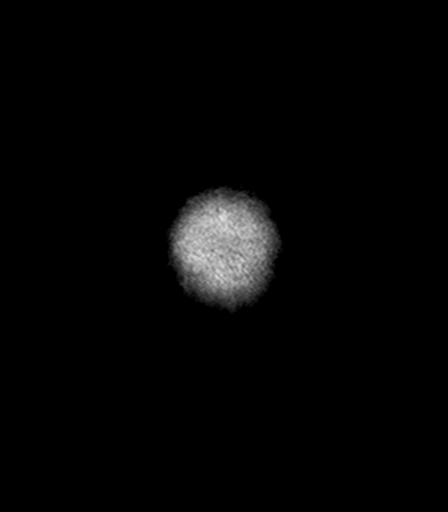

[38 of 48 positions shown; findings below may reference images not displayed]

FINDINGS: Ventricle size normal. Cerebral volume normal. Pituitary normal in
size.

Degenerative change at C1-C2 with moderate hypertrophy of the
transverse ligament. No spinal stenosis at the cervical medullary
junction.

Normal orbit bilaterally. Paranasal sinuses have minimal mucosal
edema ethmoid.

Negative for acute infarct. No significant chronic ischemic change.
Cerebral white matter normal. Brainstem normal

Chronic micro hemorrhage right inferior cerebellum. No other areas
of hemorrhage

Negative for mass or edema.

Normal enhancement following contrast administration. No enhancing
mass lesion.
IMPRESSION: No acute abnormality.

Solitary focus of chronic microhemorrhage right inferior cerebellum.
This could be related to chronic hypertension or prior hemorrhagic
infarction. Otherwise normal study for age.

These results will be called to the ordering clinician or
representative by the Radiologist Assistant, and communication
documented in the PACS or zVision Dashboard.

## 2018-03-21 IMAGING — DX DG CHEST 1V PORT
2 series · 2 of 2 positions shown · non-contrast
Comparison: CT 11/05/2011

CLINICAL DATA: Shortness of breath and dizziness with slow heart
rate.

EXAM:
PORTABLE CHEST 1 VIEW

[chest ap (1 of 2)]
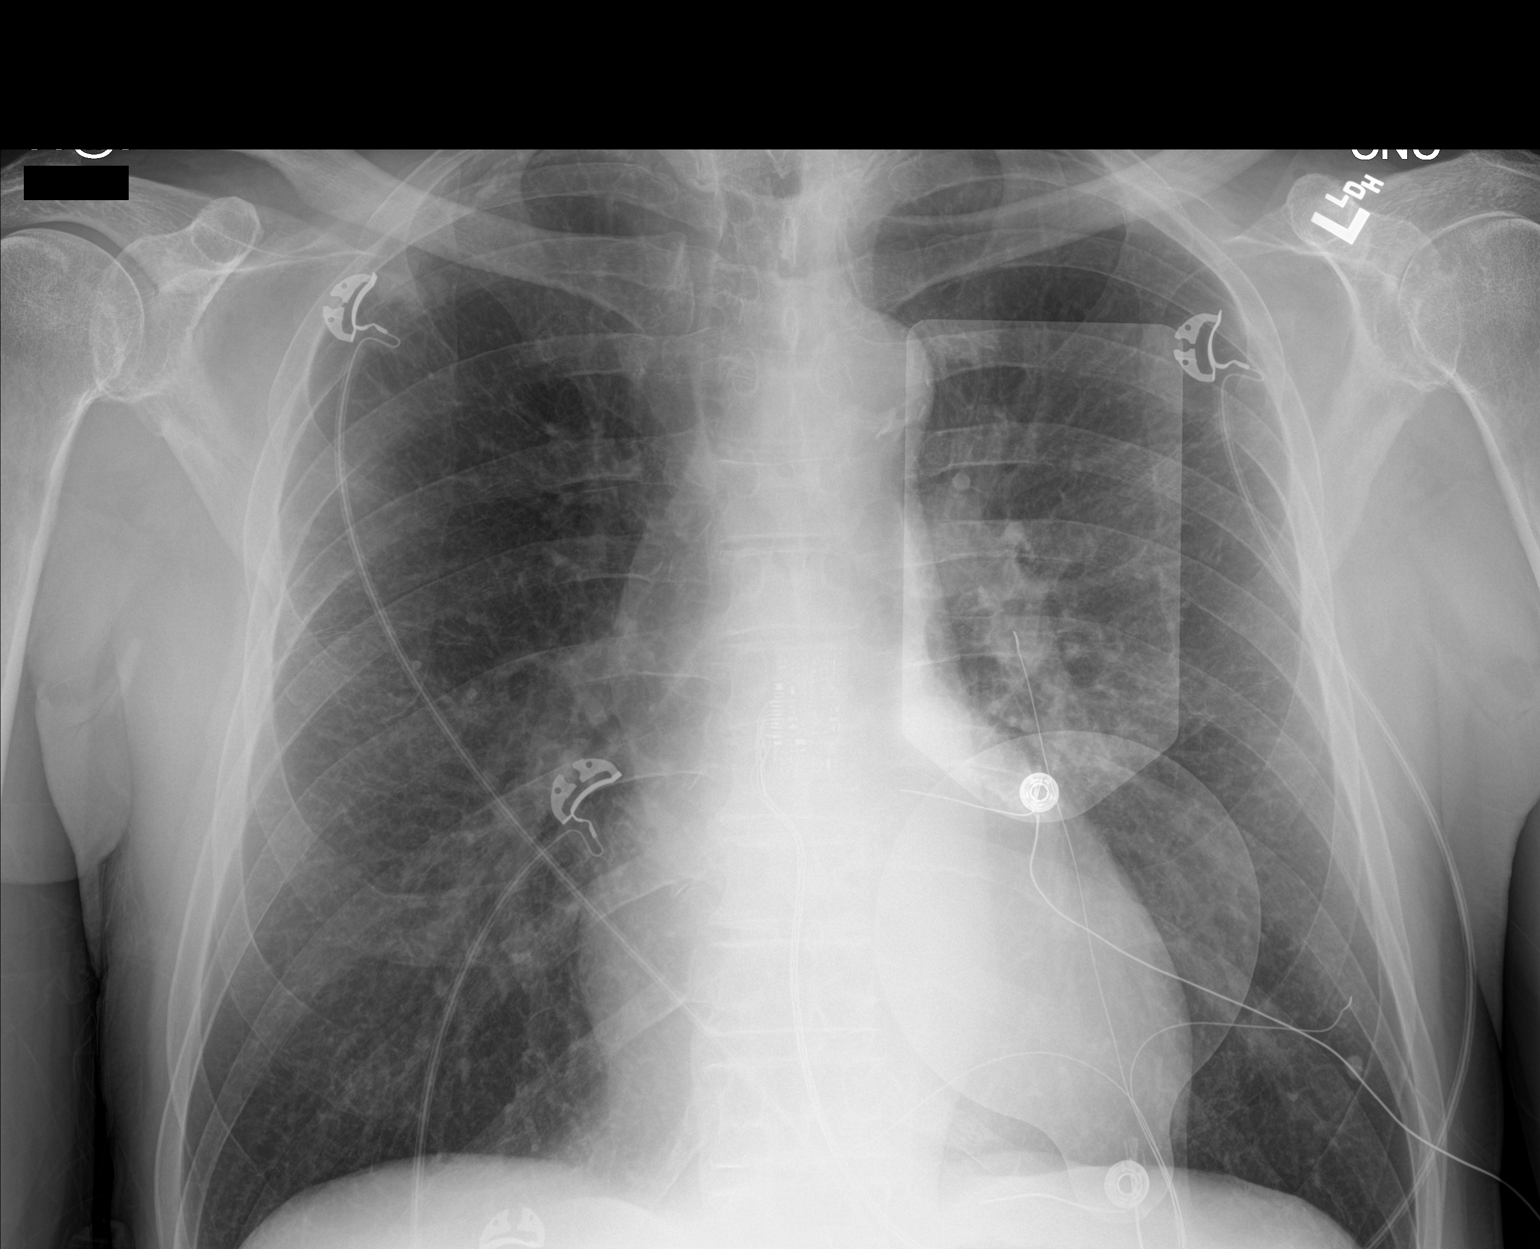

[chest ap (2 of 2)]
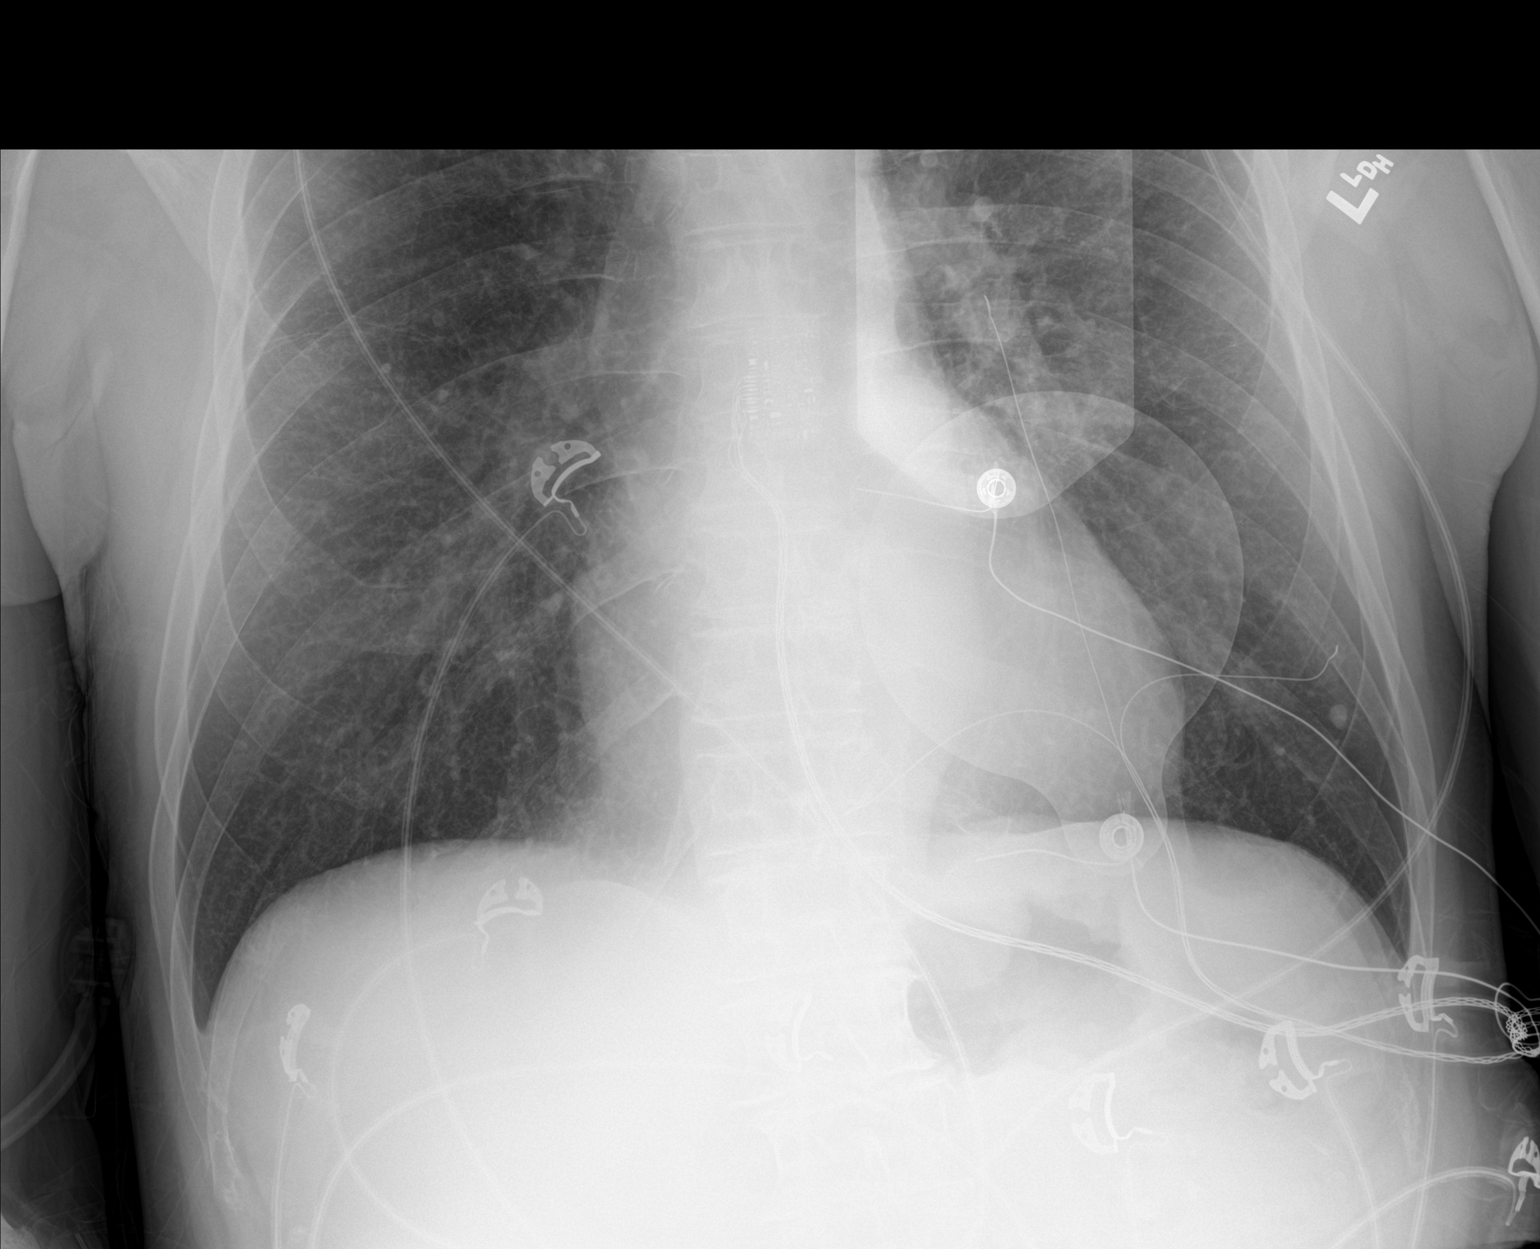

[2 of 2 positions shown; findings below may reference images not displayed]

FINDINGS: Lungs are adequately inflated without focal consolidation or
effusion. There is a calcified granuloma over the left base. Mild
cardiomegaly. Calcified plaque over the aortic arch. Minimal
degenerative change of the spine.
IMPRESSION: No acute cardiopulmonary disease.

Mild cardiomegaly.

Aortic atherosclerosis.

## 2018-04-02 NOTE — Telephone Encounter (Signed)
Opened in error
